# Patient Record
Sex: Female | Born: 1975 | Race: White | Hispanic: No | Marital: Married | Smoking: Never smoker
Health system: Southern US, Community
[De-identification: ages and names within clinical notes are randomized; demographics above are authoritative.]

## PROBLEM LIST (undated history)

## (undated) DIAGNOSIS — M7650 Patellar tendinitis, unspecified knee: Secondary | ICD-10-CM

## (undated) DIAGNOSIS — K5900 Constipation, unspecified: Secondary | ICD-10-CM

## (undated) DIAGNOSIS — R51 Headache: Secondary | ICD-10-CM

## (undated) DIAGNOSIS — D649 Anemia, unspecified: Secondary | ICD-10-CM

## (undated) DIAGNOSIS — I493 Ventricular premature depolarization: Secondary | ICD-10-CM

## (undated) DIAGNOSIS — K219 Gastro-esophageal reflux disease without esophagitis: Secondary | ICD-10-CM

## (undated) DIAGNOSIS — J302 Other seasonal allergic rhinitis: Secondary | ICD-10-CM

## (undated) DIAGNOSIS — E785 Hyperlipidemia, unspecified: Secondary | ICD-10-CM

## (undated) DIAGNOSIS — G4700 Insomnia, unspecified: Secondary | ICD-10-CM

## (undated) DIAGNOSIS — Z9289 Personal history of other medical treatment: Secondary | ICD-10-CM

## (undated) HISTORY — DX: Personal history of other medical treatment: Z92.89

## (undated) HISTORY — DX: Constipation, unspecified: K59.00

## (undated) HISTORY — DX: Other seasonal allergic rhinitis: J30.2

## (undated) HISTORY — DX: Patellar tendinitis, unspecified knee: M76.50

## (undated) HISTORY — PX: OTHER SURGICAL HISTORY: SHX169

## (undated) HISTORY — DX: Gastro-esophageal reflux disease without esophagitis: K21.9

## (undated) HISTORY — PX: WISDOM TOOTH EXTRACTION: SHX21

## (undated) HISTORY — DX: Insomnia, unspecified: G47.00

## (undated) HISTORY — DX: Hyperlipidemia, unspecified: E78.5

## (undated) HISTORY — DX: Headache: R51

## (undated) HISTORY — DX: Anemia, unspecified: D64.9

## (undated) HISTORY — DX: Ventricular premature depolarization: I49.3

---

## 2003-12-27 ENCOUNTER — Ambulatory Visit: Payer: Self-pay | Admitting: Family Medicine

## 2004-07-03 ENCOUNTER — Ambulatory Visit: Payer: Self-pay | Admitting: Family Medicine

## 2004-07-21 ENCOUNTER — Ambulatory Visit: Payer: Self-pay | Admitting: Family Medicine

## 2004-10-13 ENCOUNTER — Ambulatory Visit: Payer: Self-pay | Admitting: Family Medicine

## 2004-12-29 ENCOUNTER — Ambulatory Visit: Payer: Self-pay | Admitting: Family Medicine

## 2005-04-29 ENCOUNTER — Ambulatory Visit: Payer: Self-pay | Admitting: Family Medicine

## 2006-02-16 ENCOUNTER — Ambulatory Visit: Payer: Self-pay | Admitting: Family Medicine

## 2007-01-13 ENCOUNTER — Telehealth: Payer: Self-pay | Admitting: *Deleted

## 2007-03-29 ENCOUNTER — Ambulatory Visit: Payer: Self-pay | Admitting: Family Medicine

## 2007-03-29 DIAGNOSIS — G4700 Insomnia, unspecified: Secondary | ICD-10-CM | POA: Insufficient documentation

## 2007-03-29 DIAGNOSIS — D649 Anemia, unspecified: Secondary | ICD-10-CM | POA: Insufficient documentation

## 2007-03-29 DIAGNOSIS — E785 Hyperlipidemia, unspecified: Secondary | ICD-10-CM | POA: Insufficient documentation

## 2007-03-29 HISTORY — DX: Hyperlipidemia, unspecified: E78.5

## 2007-03-29 HISTORY — DX: Insomnia, unspecified: G47.00

## 2007-03-29 HISTORY — DX: Anemia, unspecified: D64.9

## 2007-04-20 ENCOUNTER — Ambulatory Visit: Payer: Self-pay | Admitting: Family Medicine

## 2007-04-20 LAB — CONVERTED CEMR LAB
Blood in Urine, dipstick: NEGATIVE
Glucose, Urine, Semiquant: NEGATIVE
Nitrite: NEGATIVE
Specific Gravity, Urine: 1.02
Urobilinogen, UA: 0.2
WBC Urine, dipstick: NEGATIVE
pH: 7

## 2007-05-01 LAB — CONVERTED CEMR LAB
ALT: 15 units/L (ref 0–35)
AST: 14 units/L (ref 0–37)
Albumin: 4.1 g/dL (ref 3.5–5.2)
Alkaline Phosphatase: 29 units/L — ABNORMAL LOW (ref 39–117)
BUN: 11 mg/dL (ref 6–23)
Basophils Absolute: 0 10*3/uL (ref 0.0–0.1)
Basophils Relative: 0.2 % (ref 0.0–1.0)
Bilirubin, Direct: 0.1 mg/dL (ref 0.0–0.3)
CO2: 30 meq/L (ref 19–32)
Calcium: 9.2 mg/dL (ref 8.4–10.5)
Chloride: 105 meq/L (ref 96–112)
Cholesterol: 189 mg/dL (ref 0–200)
Creatinine, Ser: 0.9 mg/dL (ref 0.4–1.2)
Eosinophils Absolute: 0.1 10*3/uL (ref 0.0–0.7)
Eosinophils Relative: 2.5 % (ref 0.0–5.0)
GFR calc Af Amer: 93 mL/min
GFR calc non Af Amer: 77 mL/min
Glucose, Bld: 85 mg/dL (ref 70–99)
HCT: 40.6 % (ref 36.0–46.0)
HDL: 55 mg/dL (ref >39.0)
Hemoglobin: 13.6 g/dL (ref 12.0–15.0)
LDL Cholesterol: 112 mg/dL — ABNORMAL HIGH (ref 0–99)
Lymphocytes Relative: 55 % — ABNORMAL HIGH (ref 12.0–46.0)
MCHC: 33.5 g/dL (ref 30.0–36.0)
MCV: 95.1 fL (ref 78.0–100.0)
Monocytes Absolute: 0.3 10*3/uL (ref 0.1–1.0)
Monocytes Relative: 6.6 % (ref 3.0–12.0)
Neutro Abs: 1.7 10*3/uL (ref 1.4–7.7)
Neutrophils Relative %: 35.7 % — ABNORMAL LOW (ref 43.0–77.0)
Platelets: 303 10*3/uL (ref 150–400)
Potassium: 4.3 meq/L (ref 3.5–5.1)
RBC: 4.27 M/uL (ref 3.87–5.11)
RDW: 12 % (ref 11.5–14.6)
Sodium: 140 meq/L (ref 135–145)
TSH: 1.58 microintl units/mL (ref 0.35–5.50)
Total Bilirubin: 0.7 mg/dL (ref 0.3–1.2)
Total CHOL/HDL Ratio: 3.4
Total Protein: 6.8 g/dL (ref 6.0–8.3)
Triglycerides: 111 mg/dL (ref 0–149)
VLDL: 22 mg/dL (ref 0–40)
WBC: 4.7 10*3/uL (ref 4.5–10.5)

## 2007-05-03 ENCOUNTER — Ambulatory Visit: Payer: Self-pay | Admitting: Family Medicine

## 2007-05-05 ENCOUNTER — Telehealth: Payer: Self-pay | Admitting: Family Medicine

## 2007-05-05 LAB — CONVERTED CEMR LAB: Hep B S Ab: NEGATIVE

## 2007-05-12 ENCOUNTER — Ambulatory Visit: Payer: Self-pay | Admitting: Family Medicine

## 2007-05-12 DIAGNOSIS — R519 Headache, unspecified: Secondary | ICD-10-CM | POA: Insufficient documentation

## 2007-05-12 DIAGNOSIS — R51 Headache: Secondary | ICD-10-CM

## 2007-05-12 HISTORY — DX: Headache: R51

## 2007-05-17 ENCOUNTER — Telehealth (INDEPENDENT_AMBULATORY_CARE_PROVIDER_SITE_OTHER): Payer: Self-pay | Admitting: *Deleted

## 2007-05-18 ENCOUNTER — Ambulatory Visit: Payer: Self-pay | Admitting: Internal Medicine

## 2007-06-13 ENCOUNTER — Ambulatory Visit: Payer: Self-pay | Admitting: Family Medicine

## 2007-08-17 ENCOUNTER — Encounter: Payer: Self-pay | Admitting: Family Medicine

## 2007-11-14 ENCOUNTER — Ambulatory Visit: Payer: Self-pay | Admitting: Family Medicine

## 2008-01-12 HISTORY — PX: UPPER GASTROINTESTINAL ENDOSCOPY: SHX188

## 2008-01-23 ENCOUNTER — Ambulatory Visit: Payer: Self-pay | Admitting: Family Medicine

## 2008-05-13 ENCOUNTER — Ambulatory Visit: Payer: Self-pay | Admitting: Family Medicine

## 2008-05-13 DIAGNOSIS — K219 Gastro-esophageal reflux disease without esophagitis: Secondary | ICD-10-CM | POA: Insufficient documentation

## 2008-05-13 HISTORY — DX: Gastro-esophageal reflux disease without esophagitis: K21.9

## 2008-05-16 ENCOUNTER — Ambulatory Visit: Payer: Self-pay | Admitting: Gastroenterology

## 2008-05-16 ENCOUNTER — Telehealth (INDEPENDENT_AMBULATORY_CARE_PROVIDER_SITE_OTHER): Payer: Self-pay | Admitting: *Deleted

## 2008-05-16 DIAGNOSIS — K5900 Constipation, unspecified: Secondary | ICD-10-CM

## 2008-05-16 HISTORY — DX: Constipation, unspecified: K59.00

## 2008-05-31 ENCOUNTER — Ambulatory Visit: Payer: Self-pay | Admitting: Family Medicine

## 2008-05-31 DIAGNOSIS — M7650 Patellar tendinitis, unspecified knee: Secondary | ICD-10-CM

## 2008-05-31 HISTORY — DX: Patellar tendinitis, unspecified knee: M76.50

## 2008-06-11 ENCOUNTER — Ambulatory Visit: Payer: Self-pay | Admitting: Gastroenterology

## 2008-06-21 ENCOUNTER — Encounter: Payer: Self-pay | Admitting: Gastroenterology

## 2008-06-24 ENCOUNTER — Encounter: Payer: Self-pay | Admitting: Gastroenterology

## 2008-09-20 ENCOUNTER — Ambulatory Visit: Payer: Self-pay | Admitting: Family Medicine

## 2008-09-24 LAB — CONVERTED CEMR LAB
BUN: 8 mg/dL (ref 6–23)
Basophils Absolute: 0 10*3/uL (ref 0.0–0.1)
Bilirubin, Direct: 0 mg/dL (ref 0.0–0.3)
CO2: 27 meq/L (ref 19–32)
Chloride: 105 meq/L (ref 96–112)
Cholesterol: 204 mg/dL — ABNORMAL HIGH (ref 0–200)
Creatinine, Ser: 0.9 mg/dL (ref 0.4–1.2)
Direct LDL: 115.1 mg/dL
Eosinophils Absolute: 0.1 10*3/uL (ref 0.0–0.7)
Glucose, Bld: 88 mg/dL (ref 70–99)
HCT: 40.3 % (ref 36.0–46.0)
Lymphs Abs: 1.1 10*3/uL (ref 0.7–4.0)
MCHC: 35.2 g/dL (ref 30.0–36.0)
MCV: 94.3 fL (ref 78.0–100.0)
Monocytes Absolute: 0.4 10*3/uL (ref 0.1–1.0)
Neutrophils Relative %: 63.9 % (ref 43.0–77.0)
Platelets: 261 10*3/uL (ref 150.0–400.0)
RDW: 11.7 % (ref 11.5–14.6)
TSH: 1.18 microintl units/mL (ref 0.35–5.50)
Total Bilirubin: 0.9 mg/dL (ref 0.3–1.2)
VLDL: 32.8 mg/dL (ref 0.0–40.0)
WBC: 4.4 10*3/uL — ABNORMAL LOW (ref 4.5–10.5)

## 2008-10-03 ENCOUNTER — Ambulatory Visit: Payer: Self-pay | Admitting: Gastroenterology

## 2008-10-03 ENCOUNTER — Ambulatory Visit: Payer: Self-pay | Admitting: Family Medicine

## 2008-10-11 ENCOUNTER — Ambulatory Visit: Payer: Self-pay | Admitting: Gastroenterology

## 2008-10-11 LAB — CONVERTED CEMR LAB: UREASE: NEGATIVE

## 2009-02-03 ENCOUNTER — Telehealth: Payer: Self-pay | Admitting: Family Medicine

## 2009-03-20 ENCOUNTER — Telehealth (INDEPENDENT_AMBULATORY_CARE_PROVIDER_SITE_OTHER): Payer: Self-pay | Admitting: *Deleted

## 2009-08-22 ENCOUNTER — Telehealth: Payer: Self-pay | Admitting: Family Medicine

## 2009-09-19 ENCOUNTER — Telehealth: Payer: Self-pay | Admitting: Family Medicine

## 2009-09-19 ENCOUNTER — Encounter: Admission: RE | Admit: 2009-09-19 | Discharge: 2009-09-19 | Payer: Self-pay | Admitting: Obstetrics and Gynecology

## 2009-09-29 ENCOUNTER — Ambulatory Visit: Payer: Self-pay | Admitting: Family Medicine

## 2009-10-01 LAB — CONVERTED CEMR LAB
ALT: 14 units/L (ref 0–35)
AST: 16 units/L (ref 0–37)
Basophils Relative: 0.8 % (ref 0.0–3.0)
Bilirubin, Direct: 0.1 mg/dL (ref 0.0–0.3)
Chloride: 103 meq/L (ref 96–112)
Cholesterol: 279 mg/dL — ABNORMAL HIGH (ref 0–200)
Creatinine, Ser: 0.7 mg/dL (ref 0.4–1.2)
Direct LDL: 184.3 mg/dL
Eosinophils Relative: 2.5 % (ref 0.0–5.0)
Free T4: 0.75 ng/dL (ref 0.60–1.60)
GFR calc non Af Amer: 95.77 mL/min (ref >60)
HCT: 41 % (ref 36.0–46.0)
Leukocytes, UA: NEGATIVE
MCV: 94.9 fL (ref 78.0–100.0)
Monocytes Absolute: 0.3 10*3/uL (ref 0.1–1.0)
Monocytes Relative: 5.8 % (ref 3.0–12.0)
Neutrophils Relative %: 42.5 % — ABNORMAL LOW (ref 43.0–77.0)
Nitrite: NEGATIVE
Platelets: 323 10*3/uL (ref 150.0–400.0)
Potassium: 4.3 meq/L (ref 3.5–5.1)
RBC: 4.31 M/uL (ref 3.87–5.11)
Specific Gravity, Urine: 1.025 (ref 1.000–1.030)
T3, Free: 2.9 pg/mL (ref 2.3–4.2)
Total Bilirubin: 0.3 mg/dL (ref 0.3–1.2)
Total CHOL/HDL Ratio: 4
Total Protein: 6.4 g/dL (ref 6.0–8.3)
Urobilinogen, UA: 0.2 (ref 0.0–1.0)
VLDL: 28 mg/dL (ref 0.0–40.0)
WBC: 5.2 10*3/uL (ref 4.5–10.5)
pH: 6 (ref 5.0–8.0)

## 2009-10-13 ENCOUNTER — Ambulatory Visit: Payer: Self-pay | Admitting: Family Medicine

## 2009-10-31 ENCOUNTER — Ambulatory Visit: Payer: Self-pay | Admitting: Family Medicine

## 2010-01-11 HISTORY — PX: HAMMER TOE SURGERY: SHX385

## 2010-01-22 ENCOUNTER — Telehealth: Payer: Self-pay | Admitting: Family Medicine

## 2010-01-29 ENCOUNTER — Encounter: Payer: Self-pay | Admitting: *Deleted

## 2010-01-30 ENCOUNTER — Ambulatory Visit
Admission: RE | Admit: 2010-01-30 | Discharge: 2010-01-30 | Payer: Self-pay | Source: Home / Self Care | Attending: Family Medicine | Admitting: Family Medicine

## 2010-01-30 ENCOUNTER — Encounter: Payer: Self-pay | Admitting: Family Medicine

## 2010-01-30 ENCOUNTER — Other Ambulatory Visit: Payer: Self-pay | Admitting: Family Medicine

## 2010-01-30 LAB — HEPATIC FUNCTION PANEL
ALT: 34 U/L (ref 0–35)
AST: 24 U/L (ref 0–37)
Albumin: 4.1 g/dL (ref 3.5–5.2)
Alkaline Phosphatase: 37 U/L — ABNORMAL LOW (ref 39–117)
Bilirubin, Direct: 0.1 mg/dL (ref 0.0–0.3)
Total Bilirubin: 0.4 mg/dL (ref 0.3–1.2)
Total Protein: 6.7 g/dL (ref 6.0–8.3)

## 2010-01-30 LAB — LIPID PANEL
Cholesterol: 200 mg/dL (ref 0–200)
HDL: 66.5 mg/dL (ref >39.00)
LDL Cholesterol: 104 mg/dL — ABNORMAL HIGH (ref 0–99)
Total CHOL/HDL Ratio: 3
Triglycerides: 149 mg/dL (ref 0.0–149.0)
VLDL: 29.8 mg/dL (ref 0.0–40.0)

## 2010-02-10 NOTE — Progress Notes (Signed)
Summary: f/u?  Phone Note Call from Patient Call back at Home Phone [phone]   Caller: Patient Call For: Dr. Jarold Motto Reason for Call: Talk to Nurse Summary of Call: pt wants to know if she is needing to sch a f/u appt with Dr. Jarold Motto since her EGD in October of 2010 Initial call taken by: Vallarie Mare,  March 20, 2009 10:29 AM  Follow-up for Phone Call        yes..see nme.. Follow-up by: Mardella Layman MD Clementeen Graham,  March 20, 2009 11:56 AM  Additional Follow-up for Phone Call Additional follow up Details #1::        Please sch follow up appt with Dr. Jarold Motto at pt's convience.  thanks Additional Follow-up by: Ashok Cordia RN,  March 20, 2009 12:16 PM    Additional Follow-up for Phone Call Additional follow up Details #3:: Details for Additional Follow-up Action Taken: Called pt. and L/M on voicemail to call back to schedule f/u appt. w/Dr. Jarold Motto Additional Follow-up by: Karna Christmas,  March 20, 2009 2:29 PM

## 2010-02-10 NOTE — Progress Notes (Signed)
Summary: refill zolpidem  Phone Note Refill Request Message from:  Fax from Pharmacy on August 22, 2009 9:10 AM  Refills Requested: Medication #1:  ZOLPIDEM TARTRATE 10 MG TABS at bedtime as needed   Dosage confirmed as above?Dosage Confirmed   Last Refilled: 07/15/2009  Method Requested: Fax to Local Pharmacy Initial call taken by: Raechel Ache, RN,  August 22, 2009 9:11 AM Caller: [facility] [phone]*  Follow-up for Phone Call        call in #30 with 5 rf Follow-up by: Nelwyn Salisbury MD,  August 22, 2009 2:19 PM  Additional Follow-up for Phone Call Additional follow up Details #1::        Rx faxed to pharmacy Additional Follow-up by: Raechel Ache, RN,  August 22, 2009 2:29 PM    Prescriptions: ZOLPIDEM TARTRATE 10 MG TABS (ZOLPIDEM TARTRATE) at bedtime as needed  #30 x 5   Entered by:   Raechel Ache, RN   Authorized by:   Nelwyn Salisbury MD   Signed by:   Raechel Ache, RN on 08/22/2009   Method used:   Historical   RxID:   [id number]

## 2010-02-10 NOTE — Progress Notes (Signed)
Summary: refill zolpidem  Phone Note From Pharmacy   Caller: [id number] Call For: Ari Engelbrecht  Summary of Call: refill zolpidem tartrate 10mg  1 by mouth at bedtime Initial call taken by: Alfred Levins, CMA,  February 03, 2009 2:07 PM  Follow-up for Phone Call        No I gave her a 6 month supply in September Follow-up by: Nelwyn Salisbury MD,  February 03, 2009 5:15 PM  Additional Follow-up for Phone Call Additional follow up Details #1::        pharmacy said they lost the refills due to transferring rx to a new pharmacy.  Pharmacy is req a new rx Additional Follow-up by: Alfred Levins, CMA,  February 03, 2009 5:17 PM    Additional Follow-up for Phone Call Additional follow up Details #2::    call in #30 with 5 rf Follow-up by: Nelwyn Salisbury MD,  February 04, 2009 8:43 AM  Additional Follow-up for Phone Call Additional follow up Details #3:: Details for Additional Follow-up Action Taken: rx called in Additional Follow-up by: Alfred Levins, CMA,  February 04, 2009 9:15 AM  Prescriptions: ZOLPIDEM TARTRATE 10 MG TABS (ZOLPIDEM TARTRATE) at bedtime as needed  #30 x 5   Entered by:   Alfred Levins, CMA   Authorized by:   Nelwyn Salisbury MD   Signed by:   Alfred Levins, CMA on 02/04/2009   Method used:   Telephoned to ...       [facility] [address] (retail)       [address]       [place], [place]  [address]       Ph: [phone] or [phone]       Fax: [phone]   RxID:   [id number]

## 2010-02-10 NOTE — Assessment & Plan Note (Signed)
Summary: CPX/CJR   Vital Signs:  Patient profile:   35 year old female Height:      63.5 inches Weight:      155 pounds BMI:     27.12 O2 Sat:      98 % Temp:     98.9 degrees F Pulse rate:   86 / minute BP sitting:   120 / 80  (left arm)  Vitals Entered By: Pura Spice, RN (October 13, 2009 2:46 PM) CC: cpx discuss labs Is Patient Diabetic? No   History of Present Illness: 35 yr old female for a cpx. She feels fine in general, although she admits to putting on some weight in the past year.   Allergies: 1)  ! Sulfa  Past History:  Past Medical History: Reviewed history from 10/03/2008 and no changes required. Anemia-NOS Hyperlipidemia insomnia GERD, sees Dr. Jarold Motto sees Dr. Annye Rusk for GYN exams  Past Surgical History: Reviewed history from 06/11/2008 and no changes required. Mole removed from tear duct  Past History:  Care Management: Gynecology: Dr Rito Ehrlich    Family History: Reviewed history from 06/11/2008 and no changes required. Family History of CAD Female 1st degree relative <50: Father Family History High cholesterol Family History of Breast Cancer:Mother Family History of Diabetes:Father  No FH of Colon Cancer: Family History of Ovarian Cancer: Aunt  Social History: Reviewed history from 10/03/2008 and no changes required. Married Alcohol use-yes-2-3 beers/week Drug use-no Occupation: Flight Attendant Patient is a former smoker. -stopped in high school. Daily Caffeine Use -2 Patient does not get regular exercise.   Review of Systems  The patient denies anorexia, fever, weight loss, vision loss, decreased hearing, hoarseness, chest pain, syncope, dyspnea on exertion, peripheral edema, prolonged cough, headaches, hemoptysis, abdominal pain, melena, hematochezia, severe indigestion/heartburn, hematuria, incontinence, genital sores, muscle weakness, suspicious skin lesions, transient blindness, difficulty walking, depression, unusual  weight change, abnormal bleeding, enlarged lymph nodes, angioedema, breast masses, and testicular masses.         Flu Vaccine Consent Questions     Do you have a history of severe allergic reactions to this vaccine? no    Any prior history of allergic reactions to egg and/or gelatin? no    Do you have a sensitivity to the preservative Thimersol? no    Do you have a past history of Guillan-Barre Syndrome? no    Do you currently have an acute febrile illness? no    Have you ever had a severe reaction to latex? no    Vaccine information given and explained to patient? yes    Are you currently pregnant? no    Lot Number:[id number]   Exp Date:07/11/2010   Site Given  Left Deltoid IM Pura Spice, RN  October 13, 2009 3:25 PM   Physical Exam  General:  Well-developed,well-nourished,in no acute distress; alert,appropriate and cooperative throughout examination Head:  Normocephalic and atraumatic without obvious abnormalities. No apparent alopecia or balding. Eyes:  No corneal or conjunctival inflammation noted. EOMI. Perrla. Funduscopic exam benign, without hemorrhages, exudates or papilledema. Vision grossly normal. Ears:  External ear exam shows no significant lesions or deformities.  Otoscopic examination reveals clear canals, tympanic membranes are intact bilaterally without bulging, retraction, inflammation or discharge. Hearing is grossly normal bilaterally. Nose:  External nasal examination shows no deformity or inflammation. Nasal mucosa are pink and moist without lesions or exudates. Mouth:  Oral mucosa and oropharynx without lesions or exudates.  Teeth in good repair. Neck:  No deformities,  masses, or tenderness noted. Chest Wall:  No deformities, masses, or tenderness noted. Lungs:  Normal respiratory effort, chest expands symmetrically. Lungs are clear to auscultation, no crackles or wheezes. Heart:  Normal rate and regular rhythm. S1 and S2 normal without gallop, murmur, click, rub  or other extra sounds. Abdomen:  Bowel sounds positive,abdomen soft and non-tender without masses, organomegaly or hernias noted. Msk:  No deformity or scoliosis noted of thoracic or lumbar spine.   Pulses:  R and L carotid,radial,femoral,dorsalis pedis and posterior tibial pulses are full and equal bilaterally Extremities:  No clubbing, cyanosis, edema, or deformity noted with normal full range of motion of all joints.   Neurologic:  No cranial nerve deficits noted. Station and gait are normal. Plantar reflexes are down-going bilaterally. DTRs are symmetrical throughout. Sensory, motor and coordinative functions appear intact. Skin:  Intact without suspicious lesions or rashes Cervical Nodes:  No lymphadenopathy noted Axillary Nodes:  No palpable lymphadenopathy Inguinal Nodes:  No significant adenopathy Psych:  Cognition and judgment appear intact. Alert and cooperative with normal attention span and concentration. No apparent delusions, illusions, hallucinations   Impression & Recommendations:  Problem # 1:  HEALTH MAINTENANCE EXAM (ICD-V70.0)  Complete Medication List: 1)  Yasmin 28 3-0.03 Mg Tabs (Drospirenone-ethinyl estradiol) .Marland Kitchen.. 1 by mouth once daily 2)  Zolpidem Tartrate 10 Mg Tabs (Zolpidem tartrate) .... At bedtime as needed 3)  Prilosec Otc 20 Mg Tbec (Omeprazole magnesium) .... One by mouth daily as needed 4)  [facility] Ibuprofen 200 Mg Tabs (Ibuprofen) .... Take 3 tablets by mouth as needed 5)  Zocor 40 Mg Tabs (Simvastatin) .Marland Kitchen.. 1 by mouth once daily  Other Orders: Admin 1st Vaccine (16109) Flu Vaccine 77yrs + (60454)  Patient Instructions: 1)  It is important that you exercise reguarly at least 20 minutes 5 times a week. If you develop chest pain, have severe difficulty breathing, or feel very tired, stop exercising immediately and seek medical attention.  2)  You need to lose weight. Consider a lower calorie diet and regular exercise.  3)  Please schedule a follow-up  appointment in 3 months .

## 2010-02-10 NOTE — Progress Notes (Signed)
Summary: Pt is req to have complete multi lvl thyroid panel added to cpx   Phone Note Call from Patient Call back at Home Phone [phone]   Caller: Patient Summary of Call: Pt called and has sch her cpx and labs. Pt is req to get a complete multi lvl thyroid panel added to cpx labs. Pls advise.  Initial call taken by: Lucy Antigua,  September 19, 2009 10:01 AM  Follow-up for Phone Call        okay. In addition to the TSH, order a free T3 and free T4 for V70.0 Follow-up by: Nelwyn Salisbury MD,  September 19, 2009 1:22 PM  Additional Follow-up for Phone Call Additional follow up Details #1::        done Additional Follow-up by: Pura Spice, RN,  September 19, 2009 2:15 PM

## 2010-02-10 NOTE — Assessment & Plan Note (Signed)
Summary: fu on tick bite/njr   Vital Signs:  Patient profile:   35 year old female O2 Sat:      99 % Temp:     98.9 degrees F Pulse rate:   101 / minute BP sitting:   104 / 72  (left arm)  Vitals Entered By: Pura Spice, RN (October 31, 2009 8:48 AM) CC: pulled tick from rt quadrant  was put on z pk some reddness from site.    History of Present Illness: Here to follow up after a tick bite which she discovered 4 days ago. She saw the tick on her right flank and pulled it off. She went to an Urgent Care clinic, and they gave her a Zpack. She has now taken 4 days of this. She feels fine, no myalgias, no rashes , etc.   Allergies: 1)  ! Sulfa  Past History:  Past Medical History: Reviewed history from 10/03/2008 and no changes required. Anemia-NOS Hyperlipidemia insomnia GERD, sees Dr. Jarold Motto sees Dr. Annye Rusk for GYN exams  Review of Systems  The patient denies anorexia, fever, weight loss, weight gain, vision loss, decreased hearing, hoarseness, chest pain, syncope, dyspnea on exertion, peripheral edema, prolonged cough, headaches, hemoptysis, abdominal pain, melena, hematochezia, severe indigestion/heartburn, hematuria, incontinence, genital sores, muscle weakness, suspicious skin lesions, transient blindness, difficulty walking, depression, unusual weight change, abnormal bleeding, enlarged lymph nodes, angioedema, breast masses, and testicular masses.    Physical Exam  General:  Well-developed,well-nourished,in no acute distress; alert,appropriate and cooperative throughout examination Skin:  Intact without suspicious lesions or rashes   Impression & Recommendations:  Problem # 1:  TICK BITE (ICD-E906.4)  Complete Medication List: 1)  Yasmin 28 3-0.03 Mg Tabs (Drospirenone-ethinyl estradiol) .Marland Kitchen.. 1 by mouth once daily 2)  Zolpidem Tartrate 10 Mg Tabs (Zolpidem tartrate) .... At bedtime as needed 3)  [facility]vs Ibuprofen 200 Mg Tabs (Ibuprofen) .... Take 3  tablets by mouth as needed 4)  Zocor 40 Mg Tabs (Simvastatin) .Marland Kitchen.. 1 by mouth once daily  Patient Instructions: 1)  Finish out the antibiotics.  2)  Please schedule a follow-up appointment as needed .    Orders Added: 1)  Est. Patient Level III [62703]

## 2010-02-12 NOTE — Assessment & Plan Note (Signed)
Summary: 3 month rov/pt will come in fasting/njr   Vital Signs:  Patient profile:   35 year old female Weight:      156 pounds O2 Sat:      98 % Temp:     98.8 degrees F Pulse rate:   68 / minute BP sitting:   120 / 80  (left arm)  Vitals Entered By: Pura Spice, RN (January 30, 2010 8:40 AM) CC: 3 month follow up pt fasting can't tolerate zocor  going to weight watchers  on red yeast arice    History of Present Illness: Here to recheck lipids. She took Zocor for 2 1/2 months, but stopped it 2 weeks ago due to complaints of fatigue, musclke aches, and "tunnel vision". These have all resolved now and she feels fine. She started taking OTC red yeast rice 2 days ago.   Allergies: 1)  ! Sulfa  Past History:  Past Medical History: Reviewed history from 10/03/2008 and no changes required. Anemia-NOS Hyperlipidemia insomnia GERD, sees Dr. Jarold Motto sees Dr. Annye Rusk for GYN exams  Review of Systems  The patient denies anorexia, fever, weight loss, weight gain, vision loss, decreased hearing, hoarseness, chest pain, syncope, dyspnea on exertion, peripheral edema, prolonged cough, headaches, hemoptysis, abdominal pain, melena, hematochezia, severe indigestion/heartburn, hematuria, incontinence, genital sores, muscle weakness, suspicious skin lesions, transient blindness, difficulty walking, depression, unusual weight change, abnormal bleeding, enlarged lymph nodes, angioedema, breast masses, and testicular masses.    Physical Exam  General:  Well-developed,well-nourished,in no acute distress; alert,appropriate and cooperative throughout examination   Impression & Recommendations:  Problem # 1:  HYPERLIPIDEMIA (ICD-272.4)  The following medications were removed from the medication list:    Zocor 40 Mg Tabs (Simvastatin) .Marland Kitchen... 1 by mouth once daily  Orders: Venipuncture (16109) TLB-Lipid Panel (80061-LIPID) TLB-Hepatic/Liver Function Pnl (80076-HEPATIC)  Complete  Medication List: 1)  Yasmin 28 3-0.03 Mg Tabs (Drospirenone-ethinyl estradiol) .Marland Kitchen.. 1 by mouth once daily 2)  Zolpidem Tartrate 10 Mg Tabs (Zolpidem tartrate) .... At bedtime as needed 3)  [facility]vs Ibuprofen 200 Mg Tabs (Ibuprofen) .... Take 3 tablets by mouth as needed 4)  Red Yeast Rice 600 Mg Caps (Red yeast rice extract)  Patient Instructions: 1)  get labs today   Orders Added: 1)  Venipuncture [36415] 2)  TLB-Lipid Panel [80061-LIPID] 3)  TLB-Hepatic/Liver Function Pnl [80076-HEPATIC] 4)  Est. Patient Level III [60454]  Appended Document: Orders Update    Clinical Lists Changes  Orders: Added new Service order of Specimen Handling (09811) - Signed

## 2010-02-12 NOTE — Progress Notes (Signed)
Summary: stopped Simvastatin  Phone Note Call from Patient Call back at Home Phone [phone]   Caller: Patient Call For: Nelwyn Salisbury MD Summary of Call: Pt is on Simvastatin, and having headaches, tunnel vision, and started taking it q o night.  What should she do?  May leave a message. Initial call taken by: [facility] CMA AAMA,  January 22, 2010 3:25 PM  Follow-up for Phone Call        stop it altogether and let me know how she is feeling in 2 weeks  Follow-up by: Nelwyn Salisbury MD,  January 23, 2010 10:52 AM  Additional Follow-up for Phone Call Additional follow up Details #1::        Left detailed message per pt request.

## 2010-03-13 ENCOUNTER — Telehealth: Payer: Self-pay | Admitting: *Deleted

## 2010-03-13 MED ORDER — ZOLPIDEM TARTRATE 10 MG PO TABS: 10.0000 mg | ORAL_TABLET | Freq: Every evening | ORAL | Status: DC | PRN

## 2010-03-13 NOTE — Telephone Encounter (Signed)
rx request faxed back to pharmacy

## 2010-03-13 NOTE — Telephone Encounter (Signed)
Call in #30 with 5 rf 

## 2010-03-13 NOTE — Telephone Encounter (Signed)
Refill zolpidem 10 mg 1 po qhs 

## 2010-07-21 ENCOUNTER — Ambulatory Visit (INDEPENDENT_AMBULATORY_CARE_PROVIDER_SITE_OTHER): Payer: Medicare HMO | Admitting: Internal Medicine

## 2010-07-21 ENCOUNTER — Encounter: Payer: Self-pay | Admitting: Internal Medicine

## 2010-07-21 DIAGNOSIS — M7650 Patellar tendinitis, unspecified knee: Secondary | ICD-10-CM

## 2010-07-21 MED ORDER — DICLOFENAC SODIUM 75 MG PO TBEC: DELAYED_RELEASE_TABLET | ORAL | Status: AC

## 2010-07-21 NOTE — Progress Notes (Signed)
  Subjective:    Patient ID: [name] Bowman, female    DOB: 12/19/1975, 35 y.o.   MRN: [id number]  HPI Pt presents to clinic for evaluation of knee pain. Notes several day history of knee pain without trauma or injury. Recently completed vacation with hiking. Denies instability or swelling of the knee. Take ibuprofen 600 mg on one occasion without significant improvement. Notes pain mostly at rest. No other alleviating or exacerbating factors. No other complaints.  Reviewed past medical history, medications and allergies.  Review of Systems  Musculoskeletal: Positive for arthralgias. Negative for back pain, joint swelling and gait problem.  Skin: Negative for color change and rash.       Objective:   Physical Exam  Nursing note and vitals reviewed. Constitutional: She appears well-developed and well-nourished. No distress.  HENT:  Head: Normocephalic and atraumatic.  Eyes: Conjunctivae are normal. No scleral icterus.  Musculoskeletal:       Left knee without erythema warmth or effusion. Full range of motion. Mild crepitus noted. Slight tenderness to palpation along superior patellar tendon and medial collateral ligament. Gait normal  Neurological: She is alert.  Skin: Skin is warm and dry. No rash noted. She is not diaphoretic. No erythema.  Psychiatric: She has a normal mood and affect.          Assessment & Plan:

## 2010-07-21 NOTE — Assessment & Plan Note (Signed)
Attempt to Voltaren 75 mg twice a day for five days followed by twice a day as needed. Take with food and no other anti-inflammatories.Followup if no improvement or worsening.

## 2010-11-10 ENCOUNTER — Ambulatory Visit: Payer: Managed Care, Other (non HMO) | Attending: Sports Medicine | Admitting: Physical Therapy

## 2010-11-10 DIAGNOSIS — IMO0001 Reserved for inherently not codable concepts without codable children: Secondary | ICD-10-CM | POA: Insufficient documentation

## 2010-11-10 DIAGNOSIS — M25559 Pain in unspecified hip: Secondary | ICD-10-CM | POA: Insufficient documentation

## 2010-11-11 ENCOUNTER — Other Ambulatory Visit (INDEPENDENT_AMBULATORY_CARE_PROVIDER_SITE_OTHER): Payer: Managed Care, Other (non HMO)

## 2010-11-11 ENCOUNTER — Ambulatory Visit: Payer: Managed Care, Other (non HMO) | Admitting: Physical Therapy

## 2010-11-11 DIAGNOSIS — Z0000 Encounter for general adult medical examination without abnormal findings: Secondary | ICD-10-CM

## 2010-11-11 DIAGNOSIS — Z79899 Other long term (current) drug therapy: Secondary | ICD-10-CM

## 2010-11-11 LAB — LIPID PANEL
HDL: 65.9 mg/dL (ref >39.00)
Triglycerides: 275 mg/dL — ABNORMAL HIGH (ref 0.0–149.0)
VLDL: 55 mg/dL — ABNORMAL HIGH (ref 0.0–40.0)

## 2010-11-11 LAB — CBC WITH DIFFERENTIAL/PLATELET
Basophils Absolute: 0 10*3/uL (ref 0.0–0.1)
Basophils Relative: 0.7 % (ref 0.0–3.0)
Eosinophils Absolute: 0.1 10*3/uL (ref 0.0–0.7)
Lymphocytes Relative: 45 % (ref 12.0–46.0)
MCHC: 34.1 g/dL (ref 30.0–36.0)
MCV: 95.2 fl (ref 78.0–100.0)
Monocytes Absolute: 0.3 10*3/uL (ref 0.1–1.0)
Neutro Abs: 2.8 10*3/uL (ref 1.4–7.7)
Neutrophils Relative %: 47.5 % (ref 43.0–77.0)
RBC: 4.21 Mil/uL (ref 3.87–5.11)
RDW: 12.4 % (ref 11.5–14.6)

## 2010-11-11 LAB — BASIC METABOLIC PANEL
CO2: 22 mEq/L (ref 19–32)
Calcium: 8.4 mg/dL (ref 8.4–10.5)
Chloride: 104 mEq/L (ref 96–112)
Creatinine, Ser: 1 mg/dL (ref 0.4–1.2)
Glucose, Bld: 84 mg/dL (ref 70–99)

## 2010-11-11 LAB — POCT URINALYSIS DIPSTICK
Ketones, UA: NEGATIVE
Protein, UA: NEGATIVE
Spec Grav, UA: 1.02
Urobilinogen, UA: 0.2
pH, UA: 5.5

## 2010-11-11 LAB — HEPATIC FUNCTION PANEL
Albumin: 3.7 g/dL (ref 3.5–5.2)
Bilirubin, Direct: 0 mg/dL (ref 0.0–0.3)
Total Protein: 6.9 g/dL (ref 6.0–8.3)

## 2010-11-13 NOTE — Progress Notes (Signed)
Quick Note:  Left a message for pt to return call. ______ 

## 2010-11-16 ENCOUNTER — Ambulatory Visit: Payer: Managed Care, Other (non HMO) | Attending: Sports Medicine | Admitting: Physical Therapy

## 2010-11-16 ENCOUNTER — Telehealth: Payer: Self-pay | Admitting: Family Medicine

## 2010-11-16 DIAGNOSIS — IMO0001 Reserved for inherently not codable concepts without codable children: Secondary | ICD-10-CM | POA: Insufficient documentation

## 2010-11-16 DIAGNOSIS — M25559 Pain in unspecified hip: Secondary | ICD-10-CM | POA: Insufficient documentation

## 2010-11-16 NOTE — Telephone Encounter (Signed)
Left voice message with results.

## 2010-11-16 NOTE — Telephone Encounter (Signed)
Message copied by Baldemar Friday on Mon Nov 16, 2010  9:46 AM ------      Message from: Gershon Crane A      Created: Thu Nov 12, 2010  3:50 PM       Normal except high chol and TG. Watch the diet

## 2010-11-18 ENCOUNTER — Ambulatory Visit (INDEPENDENT_AMBULATORY_CARE_PROVIDER_SITE_OTHER): Payer: Managed Care, Other (non HMO) | Admitting: Family Medicine

## 2010-11-18 ENCOUNTER — Encounter: Payer: Self-pay | Admitting: Family Medicine

## 2010-11-18 ENCOUNTER — Ambulatory Visit: Payer: Managed Care, Other (non HMO)

## 2010-11-18 VITALS — BP 114/78 | HR 88 | Temp 98.7°F | Ht 63.5 in | Wt 162.0 lb

## 2010-11-18 DIAGNOSIS — Z23 Encounter for immunization: Secondary | ICD-10-CM

## 2010-11-18 DIAGNOSIS — Z0000 Encounter for general adult medical examination without abnormal findings: Secondary | ICD-10-CM

## 2010-11-18 MED ORDER — ZOLPIDEM TARTRATE 10 MG PO TABS: 10.0000 mg | ORAL_TABLET | Freq: Every evening | ORAL | Status: DC | PRN

## 2010-11-18 MED ORDER — ATORVASTATIN CALCIUM 20 MG PO TABS: 20.0000 mg | ORAL_TABLET | Freq: Every day | ORAL | Status: DC

## 2010-11-18 MED ORDER — NABUMETONE 500 MG PO TABS: 500.0000 mg | ORAL_TABLET | Freq: Two times a day (BID) | ORAL | Status: DC | PRN
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
## 2010-11-18 NOTE — Progress Notes (Signed)
Addended by: Aniceto Boss A on: 11/18/2010 05:05 PM   Modules accepted: Orders

## 2010-11-25 ENCOUNTER — Encounter: Payer: Medicare HMO | Admitting: Physical Therapy

## 2010-11-30 ENCOUNTER — Encounter: Payer: Medicare HMO | Admitting: Physical Therapy

## 2011-01-21 DIAGNOSIS — I493 Ventricular premature depolarization: Secondary | ICD-10-CM

## 2011-01-21 HISTORY — DX: Ventricular premature depolarization: I49.3

## 2011-06-17 ENCOUNTER — Telehealth: Payer: Self-pay | Admitting: Family Medicine

## 2011-06-17 MED ORDER — ATORVASTATIN CALCIUM 20 MG PO TABS: 20.0000 mg | ORAL_TABLET | Freq: Every day | ORAL | Status: DC

## 2011-06-17 NOTE — Telephone Encounter (Signed)
Pt requested a 90 day supply of Atorvastatin and send to [facility]. I sent script e[facility]scribe.

## 2011-07-14 ENCOUNTER — Encounter: Payer: Self-pay | Admitting: Internal Medicine

## 2011-07-14 ENCOUNTER — Ambulatory Visit (INDEPENDENT_AMBULATORY_CARE_PROVIDER_SITE_OTHER): Payer: Managed Care, Other (non HMO) | Admitting: Internal Medicine

## 2011-07-14 VITALS — BP 108/70 | Temp 98.5°F | Wt 164.0 lb

## 2011-07-14 DIAGNOSIS — R609 Edema, unspecified: Secondary | ICD-10-CM

## 2011-07-14 MED ORDER — FUROSEMIDE 20 MG PO TABS: 20.0000 mg | ORAL_TABLET | Freq: Every day | ORAL | Status: DC

## 2011-07-14 NOTE — Assessment & Plan Note (Addendum)
36 year old white female complains of ankle and foot swelling. I suspect her symptoms are related to her long car trip to [place] and [place]. She may also have mild venous insufficiency. Patient advised to use Lasix 20 mg as needed. She understands to increase intake of high potassium foods.  Follow up with her PCP in 2 weeks.  Patient advised to call office if symptoms persist or worsen.

## 2011-07-14 NOTE — Progress Notes (Signed)
Subjective:    Patient ID: [name] Bowman, female    DOB: 09/27/1975, 36 y.o.   MRN: [id number]  HPI  36 year old white female complains of foot and ankle swelling over the last several days. She recently returned from a prolonged car ride to [place] and [place]. Over last couple of days her swelling has slightly improved. Activity seems to improve her symptoms. She denies associated calf pain.  She also denies any shortness of breath or chest pain.  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 She was referred to a cardiologist.  She never had a stress Myoview. However patient reports she had normal stress echo 4-5 years ago.  Her brother is noted to have "leaky valve" and her father has history of coronary artery disease with hx of  CABG x4.  She drinks several caffeinated beverages per day.  Review of Systems No hand or facial swelling, no urinary changes  Past Medical History  Diagnosis Date  . HYPERLIPIDEMIA 03/29/2007  . ANEMIA-NOS 03/29/2007  . GERD 05/13/2008  . CONSTIPATION 05/16/2008  . Patellar tendinitis 05/31/2008  . INSOMNIA 03/29/2007  . Headache 05/12/2007    History   Social History  . Marital Status: Single    Spouse Name: N/A    Number of Children: N/A  . Years of Education: N/A   Occupational History  . Not on file.   Social History Main Topics  . Smoking status: Never Smoker   . Smokeless tobacco: Never Used  . Alcohol Use: 0.5 oz/week    1 drink(s) per week  . Drug Use: No  . Sexually Active: Not on file   Other Topics Concern  . Not on file   Social History Narrative  . No narrative on file    No past surgical history on file.  No family history on file.  Allergies  Allergen Reactions  . Sulfonamide Derivatives     REACTION: rash    Current Outpatient Prescriptions on File Prior to Visit  Medication Sig Dispense Refill  . atorvastatin (LIPITOR) 20 MG tablet Take 1 tablet (20 mg total) by mouth daily.  90  tablet  0  . fish oil-omega-3 fatty acids 1000 MG capsule Take 1 g by mouth daily.        Marland Kitchen glucosamine-chondroitin 500-400 MG tablet Take 1 tablet by mouth daily.        Marland Kitchen ibuprofen (ADVIL,MOTRIN) 200 MG tablet Take 200 mg by mouth. 3 tabs by mouth as needed       . Multiple Vitamin (MULTIVITAMIN) tablet Take 1 tablet by mouth daily.        Marland Kitchen zolpidem (AMBIEN) 10 MG tablet Take 1 tablet (10 mg total) by mouth at bedtime as needed.  30 tablet  5  . furosemide (LASIX) 20 MG tablet Take 1 tablet (20 mg total) by mouth daily.  30 tablet  0    BP 108/70  Temp 98.5 F (36.9 C) (Oral)  Wt 164 lb (74.39 kg)        Objective:   Physical Exam  Constitutional: She is oriented to person, place, and time. She appears well-developed and well-nourished.  Cardiovascular: Normal rate and normal heart sounds.        Occasional ectopic beats  Pulmonary/Chest: Effort normal. She has no wheezes.  Musculoskeletal:       Trace lower extremity edema Negative calf tenderness, negative Homans sign  Neurological: She is alert and oriented to person, place, and time.  Skin: Skin is warm and dry.  Psychiatric: She has a normal mood and affect. Her behavior is normal.      Assessment & Plan:

## 2011-07-14 NOTE — Patient Instructions (Signed)
Call our office if your foot and ankle swelling gets worse.

## 2011-09-08 ENCOUNTER — Other Ambulatory Visit: Payer: Self-pay | Admitting: Internal Medicine

## 2011-10-20 ENCOUNTER — Other Ambulatory Visit: Payer: Self-pay | Admitting: Family Medicine

## 2012-01-15 ENCOUNTER — Other Ambulatory Visit: Payer: Self-pay | Admitting: Family Medicine

## 2012-01-25 ENCOUNTER — Encounter: Payer: Self-pay | Admitting: Family Medicine

## 2012-01-25 ENCOUNTER — Ambulatory Visit (INDEPENDENT_AMBULATORY_CARE_PROVIDER_SITE_OTHER): Payer: Managed Care, Other (non HMO) | Admitting: Family Medicine

## 2012-01-25 VITALS — BP 110/72 | HR 99 | Temp 99.0°F | Wt 167.0 lb

## 2012-01-25 DIAGNOSIS — J329 Chronic sinusitis, unspecified: Secondary | ICD-10-CM

## 2012-01-25 MED ORDER — AZITHROMYCIN 250 MG PO TABS: ORAL_TABLET | ORAL | Status: DC

## 2012-01-25 NOTE — Progress Notes (Signed)
  Subjective:    Patient ID: [name] Bowman, female    DOB: 12-16-1975, 37 y.o.   MRN: [id number]  HPI Here for 10 days of sinus pressure, ear pain, PND, and yellow mucus from the nose. No cough. Using Mucinex D.    Review of Systems  Constitutional: Negative.   HENT: Positive for congestion, postnasal drip and sinus pressure.   Eyes: Negative.   Respiratory: Negative.        Objective:   Physical Exam  Constitutional: She appears well-developed and well-nourished.  HENT:  Right Ear: External ear normal.  Left Ear: External ear normal.  Nose: Nose normal.  Mouth/Throat: Oropharynx is clear and moist.  Eyes: Conjunctivae normal are normal.  Pulmonary/Chest: Effort normal and breath sounds normal.  Lymphadenopathy:    She has no cervical adenopathy.          Assessment & Plan:  Out of work today until 01-29-12.

## 2012-02-26 ENCOUNTER — Other Ambulatory Visit: Payer: Self-pay

## 2012-02-28 ENCOUNTER — Other Ambulatory Visit (INDEPENDENT_AMBULATORY_CARE_PROVIDER_SITE_OTHER): Payer: Managed Care, Other (non HMO)

## 2012-02-28 DIAGNOSIS — Z0000 Encounter for general adult medical examination without abnormal findings: Secondary | ICD-10-CM

## 2012-02-28 LAB — LIPID PANEL
Cholesterol: 180 mg/dL (ref 0–200)
LDL Cholesterol: 100 mg/dL — ABNORMAL HIGH (ref 0–99)
VLDL: 17.4 mg/dL (ref 0.0–40.0)

## 2012-02-28 LAB — HEPATIC FUNCTION PANEL
ALT: 16 U/L (ref 0–35)
Alkaline Phosphatase: 45 U/L (ref 39–117)
Bilirubin, Direct: 0.1 mg/dL (ref 0.0–0.3)
Total Protein: 6.8 g/dL (ref 6.0–8.3)

## 2012-02-28 LAB — CBC WITH DIFFERENTIAL/PLATELET
Basophils Relative: 0.7 % (ref 0.0–3.0)
Eosinophils Relative: 2.3 % (ref 0.0–5.0)
Lymphocytes Relative: 45.9 % (ref 12.0–46.0)
Monocytes Relative: 5.5 % (ref 3.0–12.0)
Neutrophils Relative %: 45.6 % (ref 43.0–77.0)
RBC: 4.52 Mil/uL (ref 3.87–5.11)
WBC: 6 10*3/uL (ref 4.5–10.5)

## 2012-02-28 LAB — BASIC METABOLIC PANEL
CO2: 28 mEq/L (ref 19–32)
Calcium: 9 mg/dL (ref 8.4–10.5)
Creatinine, Ser: 0.7 mg/dL (ref 0.4–1.2)
GFR: 94.52 mL/min (ref >60.00)
Sodium: 140 mEq/L (ref 135–145)

## 2012-02-28 LAB — POCT URINALYSIS DIPSTICK
Ketones, UA: NEGATIVE
Protein, UA: NEGATIVE
Spec Grav, UA: 1.02
pH, UA: 6

## 2012-03-01 NOTE — Progress Notes (Signed)
Quick Note:  Results were released in my chart. ______ 

## 2012-03-07 ENCOUNTER — Encounter: Payer: Self-pay | Admitting: Family Medicine

## 2012-03-07 ENCOUNTER — Ambulatory Visit (INDEPENDENT_AMBULATORY_CARE_PROVIDER_SITE_OTHER): Payer: Managed Care, Other (non HMO) | Admitting: Family Medicine

## 2012-03-07 VITALS — BP 104/68 | HR 108 | Temp 98.2°F | Wt 171.0 lb

## 2012-03-07 DIAGNOSIS — Z0000 Encounter for general adult medical examination without abnormal findings: Secondary | ICD-10-CM

## 2012-03-07 DIAGNOSIS — Z23 Encounter for immunization: Secondary | ICD-10-CM

## 2012-03-07 MED ORDER — ZOLPIDEM TARTRATE 10 MG PO TABS: 10.0000 mg | ORAL_TABLET | Freq: Every evening | ORAL | Status: DC | PRN

## 2012-03-07 NOTE — Addendum Note (Signed)
Addended by: Aniceto Boss A on: 03/07/2012 10:21 AM   Modules accepted: Orders

## 2012-03-07 NOTE — Progress Notes (Signed)
  Subjective:    Patient ID: [name] Bowman, female    DOB: [birth year], 37 y.o.   MRN: [id number]  HPI 37 yr old female for a cpx. She feels good and has no concerns. 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    Review of Systems  Constitutional: Negative.   HENT: Negative.   Eyes: Negative.   Respiratory: Negative.   Cardiovascular: Negative.   Gastrointestinal: Negative.   Genitourinary: Negative for dysuria, urgency, frequency, hematuria, flank pain, decreased urine volume, enuresis, difficulty urinating, pelvic pain and dyspareunia.  Musculoskeletal: Negative.   Skin: Negative.   Neurological: Negative.   Psychiatric/Behavioral: Negative.        Objective:   Physical Exam  Constitutional: She is oriented to person, place, and time. She appears well-developed and well-nourished. No distress.  HENT:  Head: Normocephalic and atraumatic.  Right Ear: External ear normal.  Left Ear: External ear normal.  Nose: Nose normal.  Mouth/Throat: Oropharynx is clear and moist. No oropharyngeal exudate.  Eyes: Conjunctivae and EOM are normal. Pupils are equal, round, and reactive to light. No scleral icterus.  Neck: Normal range of motion. Neck supple. No JVD present. No thyromegaly present.  Cardiovascular: Normal rate, regular rhythm, normal heart sounds and intact distal pulses.  Exam reveals no gallop and no friction rub.   No murmur heard. Pulmonary/Chest: Effort normal and breath sounds normal. No respiratory distress. She has no wheezes. She has no rales. She exhibits no tenderness.  Abdominal: Soft. Bowel sounds are normal. She exhibits no distension and no mass. There is no tenderness. There is no rebound and no guarding.  Musculoskeletal: Normal range of motion. She exhibits no edema and no tenderness.  Lymphadenopathy:    She has no cervical adenopathy.  Neurological: She is alert and oriented to person, place, and time. She  has normal reflexes. No cranial nerve deficit. She exhibits normal muscle tone. Coordination normal.  Skin: Skin is warm and dry. No rash noted. No erythema.  Psychiatric: She has a normal mood and affect. Her behavior is normal. Judgment and thought content normal.          Assessment & Plan:  Well exam.

## 2012-05-05 ENCOUNTER — Ambulatory Visit (INDEPENDENT_AMBULATORY_CARE_PROVIDER_SITE_OTHER): Payer: Managed Care, Other (non HMO) | Admitting: Family Medicine

## 2012-05-05 ENCOUNTER — Encounter: Payer: Self-pay | Admitting: Family Medicine

## 2012-05-05 VITALS — BP 106/68 | HR 95 | Temp 98.6°F | Wt 169.0 lb

## 2012-05-05 DIAGNOSIS — S91311A Laceration without foreign body, right foot, initial encounter: Secondary | ICD-10-CM

## 2012-05-05 DIAGNOSIS — S91309A Unspecified open wound, unspecified foot, initial encounter: Secondary | ICD-10-CM

## 2012-05-05 NOTE — Progress Notes (Signed)
  Subjective:    Patient ID: [name] Bowman, female    DOB: [birth year], 37 y.o.   MRN: [id number]  HPI Here for an injury to the right foot at home last night. As she walked through a doorway barefoot she caught the 5th toe on the door frame. Since then she has had mild pain when walking on it. No swelling.    Review of Systems  Constitutional: Negative.   Skin: Positive for wound.       Objective:   Physical Exam  Constitutional: She appears well-developed and well-nourished. No distress.  Musculoskeletal:  The toes on the right foot appear intact, no swelling or ecchymosis. Good alignment. There is a small laceration to the webbing between the 4th and 5th toes          Assessment & Plan:  She has a small laceration between the toes. No sign of any joint or bone injury. She will clean it thoroughly this evening one time with hydrogen peroxide, thereafter clean it once a day with soap and water. Apply Neosporin 3-4 times a day. Recheck prn

## 2012-07-25 ENCOUNTER — Telehealth: Payer: Self-pay | Admitting: Family Medicine

## 2012-07-25 NOTE — Telephone Encounter (Signed)
Pt came in right after an eye doctor appointment at [facility] where she saw Dr. Allena Katz.  She has a left eye hemorrhage and the opthomologist wanted her to get labs done. She is possibly is leaving town (flying out) tomorrow, but would you be able to order a CBC, PT/PTT her BP was 100/65 in the office and RAS @ 2:18. Please advise. Thank you. Pt says she will just get labs done when she gets back, which may be about 9 days.

## 2012-07-25 NOTE — Telephone Encounter (Signed)
I spoke with Dr. Allena Katz and it will be okay for pt to get these labs done when she returns from trip. I also spoke with pt and she will schedule lab draw when she returns from trip.

## 2012-07-28 NOTE — Telephone Encounter (Signed)
If Dr. Hazle Quant wants her to have labs done, then HE needs to order these. I cannot order tests for a problem that I am not managing.

## 2012-07-28 NOTE — Telephone Encounter (Signed)
I left a voice message with below information. 

## 2012-08-18 ENCOUNTER — Other Ambulatory Visit: Payer: Self-pay | Admitting: Family Medicine

## 2012-09-01 ENCOUNTER — Ambulatory Visit (INDEPENDENT_AMBULATORY_CARE_PROVIDER_SITE_OTHER): Payer: Managed Care, Other (non HMO) | Admitting: Family Medicine

## 2012-09-01 ENCOUNTER — Encounter: Payer: Self-pay | Admitting: Family Medicine

## 2012-09-01 VITALS — BP 110/76 | HR 110 | Temp 98.6°F | Wt 162.0 lb

## 2012-09-01 DIAGNOSIS — J019 Acute sinusitis, unspecified: Secondary | ICD-10-CM

## 2012-09-01 MED ORDER — AZITHROMYCIN 250 MG PO TABS: ORAL_TABLET | ORAL | Status: DC

## 2012-09-01 NOTE — Progress Notes (Signed)
  Subjective:    Patient ID: [name] Bowman, female    DOB: [birth year], 37 y.o.   MRN: [id number]  HPI Here for one week of PND and a ST. No fever or cough. On Advil.    Review of Systems  Constitutional: Negative.   HENT: Positive for sore throat and postnasal drip. Negative for trouble swallowing.   Eyes: Negative.   Respiratory: Negative.        Objective:   Physical Exam  Constitutional: She appears well-developed and well-nourished.  HENT:  Right Ear: External ear normal.  Left Ear: External ear normal.  Nose: Nose normal.  Mouth/Throat: No oropharyngeal exudate.  Posterior OP is red   Eyes: Conjunctivae are normal.  Neck: Neck supple.  Pulmonary/Chest: Effort normal and breath sounds normal.  Lymphadenopathy:    She has no cervical adenopathy.          Assessment & Plan:  Recheck prn

## 2012-09-08 ENCOUNTER — Encounter: Payer: Self-pay | Admitting: Family Medicine

## 2012-09-12 MED ORDER — FLUCONAZOLE 150 MG PO TABS: 150.0000 mg | ORAL_TABLET | Freq: Once | ORAL | Status: DC | PRN

## 2012-10-25 ENCOUNTER — Telehealth: Payer: Self-pay | Admitting: Family Medicine

## 2012-10-25 NOTE — Telephone Encounter (Signed)
Pt is calling to request a 3 month supply of her zolpidem (AMBIEN) 10 MG tablet. She is requesting a [facility] month [facility] [address], and [facility]. Please assist.

## 2012-10-26 MED ORDER — ZOLPIDEM TARTRATE 10 MG PO TABS: 10.0000 mg | ORAL_TABLET | Freq: Every evening | ORAL | Status: DC | PRN

## 2012-10-26 NOTE — Telephone Encounter (Signed)
I faxed script to [facility] and called in a 30 day supply.

## 2012-10-26 NOTE — Telephone Encounter (Signed)
Call in #30 locally, also do #[facility] to [facility]

## 2012-11-16 ENCOUNTER — Other Ambulatory Visit: Payer: Self-pay

## 2012-12-06 ENCOUNTER — Ambulatory Visit: Payer: Managed Care, Other (non HMO) | Admitting: Family Medicine

## 2012-12-06 ENCOUNTER — Ambulatory Visit (INDEPENDENT_AMBULATORY_CARE_PROVIDER_SITE_OTHER): Payer: Managed Care, Other (non HMO) | Admitting: Family Medicine

## 2012-12-06 ENCOUNTER — Encounter: Payer: Self-pay | Admitting: Family Medicine

## 2012-12-06 VITALS — BP 122/78 | HR 98 | Temp 99.1°F | Wt 174.0 lb

## 2012-12-06 DIAGNOSIS — J019 Acute sinusitis, unspecified: Secondary | ICD-10-CM

## 2012-12-06 MED ORDER — AZITHROMYCIN 250 MG PO TABS: ORAL_TABLET | ORAL | Status: DC

## 2012-12-06 NOTE — Progress Notes (Signed)
Pre visit review using our clinic review tool, if applicable. No additional management support is needed unless otherwise documented below in the visit note. 

## 2012-12-06 NOTE — Progress Notes (Signed)
   Subjective:    Patient ID: [name] Bowman, female    DOB: [birth year], 37 y.o.   MRN: [id number]  HPI Here for 10 days of sinus pressure, PND, and a dry cough. On Mucinex    Review of Systems  Constitutional: Negative.   HENT: Positive for congestion, postnasal drip and sinus pressure.   Eyes: Negative.   Respiratory: Positive for cough.        Objective:   Physical Exam  Constitutional: She appears well-developed and well-nourished.  HENT:  Right Ear: External ear normal.  Left Ear: External ear normal.  Nose: Nose normal.  Mouth/Throat: Oropharynx is clear and moist.  Eyes: Conjunctivae are normal.  Pulmonary/Chest: Effort normal and breath sounds normal.  Lymphadenopathy:    She has no cervical adenopathy.          Assessment & Plan:  Drink fluids

## 2013-01-06 ENCOUNTER — Ambulatory Visit (INDEPENDENT_AMBULATORY_CARE_PROVIDER_SITE_OTHER): Payer: Managed Care, Other (non HMO) | Admitting: Family Medicine

## 2013-01-06 ENCOUNTER — Encounter: Payer: Self-pay | Admitting: Family Medicine

## 2013-01-06 VITALS — BP 112/84 | Temp 99.4°F | Wt 174.0 lb

## 2013-01-06 DIAGNOSIS — J019 Acute sinusitis, unspecified: Secondary | ICD-10-CM

## 2013-01-06 MED ORDER — ZOLPIDEM TARTRATE 10 MG PO TABS: 10.0000 mg | ORAL_TABLET | Freq: Every evening | ORAL | Status: DC | PRN

## 2013-01-06 MED ORDER — AMOXICILLIN-POT CLAVULANATE 875-125 MG PO TABS: 1.0000 | ORAL_TABLET | Freq: Two times a day (BID) | ORAL | Status: DC

## 2013-01-06 NOTE — Progress Notes (Signed)
   Subjective:    Patient ID: [name] Bowman, female    DOB: 10-25-1975, 37 y.o.   MRN: [id number]  HPI Here for 4 days of sinus pressure, PND, HA, and a dry cough. She took a Zpack for a sinusitis last month and it seemed to be resolved.    Review of Systems  Constitutional: Negative.   HENT: Positive for congestion, postnasal drip and sinus pressure.   Eyes: Negative.   Respiratory: Positive for cough.        Objective:   Physical Exam  Constitutional: She appears well-developed and well-nourished.  HENT:  Right Ear: External ear normal.  Left Ear: External ear normal.  Nose: Nose normal.  Mouth/Throat: Oropharynx is clear and moist. No oropharyngeal exudate.  Eyes: Conjunctivae are normal.  Pulmonary/Chest: Effort normal and breath sounds normal.  Lymphadenopathy:    She has no cervical adenopathy.          Assessment & Plan:  We will use Augmentin this time. Add Mucinex D

## 2013-01-06 NOTE — Progress Notes (Signed)
Pre visit review using our clinic review tool, if applicable. No additional management support is needed unless otherwise documented below in the visit note. 

## 2013-01-15 ENCOUNTER — Encounter: Payer: Self-pay | Admitting: Nurse Practitioner

## 2013-01-15 ENCOUNTER — Telehealth: Payer: Self-pay | Admitting: Family Medicine

## 2013-01-15 ENCOUNTER — Ambulatory Visit (INDEPENDENT_AMBULATORY_CARE_PROVIDER_SITE_OTHER): Payer: Managed Care, Other (non HMO) | Admitting: Nurse Practitioner

## 2013-01-15 VITALS — BP 120/70 | HR 108 | Temp 98.1°F | Ht 64.5 in | Wt 175.0 lb

## 2013-01-15 DIAGNOSIS — R51 Headache: Secondary | ICD-10-CM

## 2013-01-15 NOTE — Telephone Encounter (Signed)
Patient called stating she is still having a headache a little congestion  Stated she has a fever on Tue & Wed, patient is a flight attendant And wants to know if she needs to schedule appointment and keep taking the antibiotics Please advise

## 2013-01-15 NOTE — Patient Instructions (Signed)
Stop ibuprophen, as this headache may be r/t over-the-counter medication rebound headache. Alternatively, it could be r/t recent sinus infection. Start using mucinex D or just pseudoephedrine 30- 60 mg twice daily. Start sinus rinses daily. FCall for re-evaluation if headaches are persistent beyond 1 week or get worse. Headaches, Analgesic Rebound Analgesic agents are prescription or over-the-counter medications used to control pain, including headaches. However, overuse or misuse of theses medications can lead to rebound headaches. Rebound headaches are headaches that recur after the analgesic medication wears off. Eventually, the rebound headaches can become longlasting (chronic). If this happens, you must completely stop using analgesic medications. If not, the chronic headache is likely to continue despite the use of any other treatment. Usually when you stop taking analgesic medications, the headache may initally get worse for several days. Along with this you may experience sickness in your stomach (nausea), and you may throw up (vomit). After a period of 3 to 5 days, these symptoms begin to improve. Sometimes improvement may take longer. Eventually, the headaches will slowly improve with treatment with the right medications. Most people are able to stop using analgesic medications at home with a caregiver's supervision. But some find it difficult and may require hospitalization. Document Released: 03/20/2003 Document Revised: 03/22/2011 Document Reviewed: 07/13/2012 [facility] Patient Information 2014 [employer] [place].

## 2013-01-15 NOTE — Progress Notes (Signed)
Pre-visit discussion using our clinic review tool. No additional management support is needed unless otherwise documented below in the visit note.  

## 2013-01-15 NOTE — Telephone Encounter (Signed)
[facility]

## 2013-01-15 NOTE — Progress Notes (Signed)
   Subjective:    Patient ID: [name] BullionKimberly G Stockinger, female    DOB: 09/01/1975, 38 y.o.   MRN: [id number]  Headache  This is a new problem. The current episode started 1 to 4 weeks ago (1 wk). The problem has been waxing and waning. The pain is located in the frontal and occipital region. The pain does not radiate. The pain quality is similar to prior headaches. The quality of the pain is described as aching. The pain is moderate. Pertinent negatives include no abdominal pain, back pain, coughing, dizziness, fever or sore throat. Nothing aggravates the symptoms. She has tried NSAIDs for the symptoms. The treatment provided mild relief. Her past medical history is significant for sinus disease (recent tx fro sinusitis in Nov w/azithromycin-got better, then treated 2 weeks ago with augmentin. Nasal congestion improved, HA persistent.).      Review of Systems  Constitutional: Negative for fever, chills and fatigue.  HENT: Negative for congestion and sore throat.   Respiratory: Negative for cough, chest tightness and wheezing.   Cardiovascular: Negative for chest pain.  Gastrointestinal: Negative for abdominal pain.  Musculoskeletal: Negative for back pain.  Neurological: Positive for headaches. Negative for dizziness.  Hematological: Negative for adenopathy.       Objective:   Physical Exam  Vitals reviewed. Constitutional: She is oriented to person, place, and time. She appears well-developed and well-nourished. No distress.  HENT:  Head: Normocephalic and atraumatic.  Right Ear: External ear normal.  Left Ear: External ear normal.  Nose: Nose normal.  Mouth/Throat: Oropharynx is clear and moist. No oropharyngeal exudate.  Eyes: Conjunctivae are normal. Right eye exhibits no discharge. Left eye exhibits no discharge.  Neck: Normal range of motion. Neck supple. No thyromegaly present.  Cardiovascular: Normal rate, regular rhythm and normal heart sounds.   No murmur  heard. Pulmonary/Chest: Effort normal and breath sounds normal. No respiratory distress. She has no wheezes.  Lymphadenopathy:    She has no cervical adenopathy.  Neurological: She is alert and oriented to person, place, and time.  Skin: Skin is warm and dry.  Psychiatric: She has a normal mood and affect. Her behavior is normal. Thought content normal.          Assessment & Plan:  1. Headache(784.0) May be r/t sinusitis, analgesic rebound HA See pt instructions.

## 2013-02-19 ENCOUNTER — Ambulatory Visit (INDEPENDENT_AMBULATORY_CARE_PROVIDER_SITE_OTHER): Payer: Managed Care, Other (non HMO) | Admitting: Physician Assistant

## 2013-02-19 ENCOUNTER — Encounter: Payer: Self-pay | Admitting: Physician Assistant

## 2013-02-19 ENCOUNTER — Telehealth: Payer: Self-pay | Admitting: Gastroenterology

## 2013-02-19 ENCOUNTER — Ambulatory Visit: Payer: Managed Care, Other (non HMO) | Admitting: Family Medicine

## 2013-02-19 VITALS — BP 120/70 | HR 66 | Ht 63.75 in | Wt 172.8 lb

## 2013-02-19 DIAGNOSIS — K625 Hemorrhage of anus and rectum: Secondary | ICD-10-CM

## 2013-02-19 DIAGNOSIS — K648 Other hemorrhoids: Secondary | ICD-10-CM

## 2013-02-19 MED ORDER — HYDROCORTISONE ACETATE 25 MG RE SUPP: RECTAL | Status: DC

## 2013-02-19 NOTE — Telephone Encounter (Signed)
Moved OV to 2:30 PM.

## 2013-02-19 NOTE — Telephone Encounter (Signed)
Spoke with patient and she states yesterday when she had a bowel movement she had a lot of bright, red blood on tissue. Occurred again last night. Denies constipation but does describe that stools are smaller amounts than usual. Denies history of hemorrhoids. Scheduled with Mike GipAmy Esterwood, PA today at 1:30 PM.

## 2013-02-19 NOTE — Patient Instructions (Signed)
[facility] [address] We sent enough for 7 days and 1 refill if you need to repeat the course. . Anusol HC Suppositories.  Also get yourself a tube of Preperation H Over the counter hemorrhoid cream for external use, 1-2 times daily. Also get Over the counter Colace 100 mg to keep stools soft. Call us back if bleeding occurs.

## 2013-02-19 NOTE — Progress Notes (Signed)
Subjective:    Patient ID: [name] Bowman, female    DOB: 12/07/1975, 38 y.o.   MRN: [id number]  HPI  Macon LargeKimberley a pleasant 38 year old white female known to Dr. Jarold MottoPatterson from EGD done in 2010. At that time she had gastritis, tested H. pylori positive and was treated. She comes in today with a new problem of bright red blood per rectum. She says she had onset yesterday morning had a somewhat painful bowel movement followed by bright red blood noted on the tissue and then in the commode and "coating" her bowel movement. Last night she passed bright red blood again with the bowel movement but did not have any rectal discomfort associated and this morning had a bowel movement with out any blood noted. She says over the past couple of weeks she had a couple of episodes of small amounts of bright red blood noted on the tissue. Is no complaints of abdominal pain though states that she just started working out and has been doing sit-ups and has had a little bit of abdominal soreness from that. Family history is negative for colon cancer polyps or inflammatory bowel disease.    Review of Systems  Constitutional: Negative.   HENT: Negative.   Eyes: Negative.   Respiratory: Negative.   Cardiovascular: Negative.   Gastrointestinal: Positive for constipation and anal bleeding.  Endocrine: Negative.   Genitourinary: Negative.   Musculoskeletal: Negative.   Skin: Negative.   Allergic/Immunologic: Negative.   Neurological: Negative.   Hematological: Negative.   Psychiatric/Behavioral: Negative.    Outpatient Prescriptions Prior to Visit  Medication Sig Dispense Refill  . Biotin 5000 MCG CAPS Take 5,000 mcg by mouth 2 (two) times daily.      Marland Kitchen. ibuprofen (ADVIL,MOTRIN) 200 MG tablet Take 200 mg by mouth. 3 tabs by mouth as needed      . zolpidem (AMBIEN) 10 MG tablet Take 1 tablet (10 mg total) by mouth at bedtime as needed for sleep.  30 tablet  0  . amoxicillin-clavulanate (AUGMENTIN) 875-125  MG per tablet Take 1 tablet by mouth 2 (two) times daily.  20 tablet  0  . COLLAGEN PO Take by mouth 3 (three) times daily.      . Evening Primrose Oil 500 MG CAPS Take 500 mg by mouth 2 (two) times daily.       No facility-administered medications prior to visit.   Allergies  Allergen Reactions  . Sulfonamide Derivatives     REACTION: rash   Patient Active Problem List   Diagnosis Date Noted  . Edema 07/14/2011  . PATELLAR TENDINITIS 05/31/2008  . CONSTIPATION 05/16/2008  . GERD 05/13/2008  . HEADACHE 05/12/2007  . HYPERLIPIDEMIA 03/29/2007  . ANEMIA-NOS 03/29/2007  . ACUTE SINUSITIS, UNSPECIFIED 03/29/2007  . INSOMNIA 03/29/2007   family history includes Breast cancer in her maternal aunt, maternal grandmother, and mother; Diabetes in her father; Heart disease in her father; Ovarian cancer in her maternal aunt. There is no history of Colon cancer.     History  Substance Use Topics  . Smoking status: Never Smoker   . Smokeless tobacco: Never Used  . Alcohol Use: 0.5 oz/week    1 drink(s) per week     Comment: 1-2 week    Objective:   Physical Exam   well-developed white female in no acute distress, pleasant blood pressure 120/70 pulse 66 height 5 foot 3 weight 172. , HEENT; nontraumatic normocephalic EOMI PERRLA sclera anicteric, Supple; no JVD, Cardiovascular; regular rate and rhythm  with S1-S2 no murmur or gallop, Pulm;clear bilaterally, Abdomen; soft nontender nondistended bowel sounds are active there is no palpable mass or hepatosplenomegaly, Rectal; exam externally there is a tiny shallow tear in the posterior anus on anoscopy she's really nontender and there is 1 friable internal hemorrhoid ,no blood in the rectal vault or on the endoscope, Extremities; no clubbing cyanosis or edema skin warm and dry, Psych; mood and affect normal and appropriate        Assessment & Plan:   #32  38 year old female with bright red blood per x1 day, resolved today. Anoscopy shows  internal hemorrhoid which is friable and is the likely source of her bleeding. She also has a tiny anal fissure   Plan; Anusol-HC suppositories at bedtime x7 days then repeat as needed Patient advised to increase her fluid intake and fiber intake. To also start Colace once daily over the next week or so to prevent constipation Do not think the tiny fissure was the source for her bleeding. She is advised to call back should bleeding recur over the next few months and at that time may need to consider colonoscopy

## 2013-02-22 ENCOUNTER — Ambulatory Visit: Payer: Managed Care, Other (non HMO) | Admitting: Physician Assistant

## 2013-07-27 ENCOUNTER — Encounter: Payer: Self-pay | Admitting: Family Medicine

## 2013-07-27 ENCOUNTER — Ambulatory Visit (INDEPENDENT_AMBULATORY_CARE_PROVIDER_SITE_OTHER): Payer: Managed Care, Other (non HMO) | Admitting: Family Medicine

## 2013-07-27 VITALS — BP 114/78 | HR 98 | Temp 98.8°F | Ht 63.75 in | Wt 176.0 lb

## 2013-07-27 DIAGNOSIS — R0789 Other chest pain: Secondary | ICD-10-CM

## 2013-07-27 DIAGNOSIS — IMO0001 Reserved for inherently not codable concepts without codable children: Secondary | ICD-10-CM

## 2013-07-27 DIAGNOSIS — K219 Gastro-esophageal reflux disease without esophagitis: Secondary | ICD-10-CM

## 2013-07-27 DIAGNOSIS — J00 Acute nasopharyngitis [common cold]: Secondary | ICD-10-CM

## 2013-07-27 NOTE — Patient Instructions (Signed)
GERD: -start prilosec 20mg  daily for 1 month  -We placed a referral for you as discussed for the stress test. It usually takes about 1-2 weeks to process and schedule this referral. If you have not heard from us regarding this appointment in 2 weeks please contact our office.  For the cold symptoms: -afrin for 4 days, nasocort for 4 weeks  Follow up if worsening or persisting or any concerns and in 1 month

## 2013-07-27 NOTE — Progress Notes (Signed)
No chief complaint on file.   HPI:  URI: -started: 10 days ago -symptoms:nasal congestion, sore throat, cough - this is improving -denies:fever, SOB, NVD, tooth pain -has tried: decongestant -sick contacts/travel/risks: denies flu exposure, tick exposure or or Ebola risks -Hx of: GERD  GERD: -heartburn after eating spicy foods the last few days and starting to drink coffee again -heartburn, acid in throat -denies: chest pressure, SOB, wheezing, palpitations, dysphagia   ROS: See pertinent positives and negatives per HPI.  Past Medical History  Diagnosis Date  . HYPERLIPIDEMIA 03/29/2007  . ANEMIA-NOS 03/29/2007  . GERD 05/13/2008  . CONSTIPATION 05/16/2008  . Patellar tendinitis 05/31/2008  . INSOMNIA 03/29/2007  . Headache(784.0) 05/12/2007  . 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 Kenna GilbertMcGkin in 2009 with ECHO (benign), then saw Dr. Merrily Pewarryl Kalil for exam (benign)    Past Surgical History  Procedure Laterality Date  . Hammer toe surgery  2012    right 4th and 5th toes    Family History  Problem Relation Age of Onset  . Breast cancer Mother   . Breast cancer Maternal Grandmother   . Breast cancer Maternal Aunt   . Ovarian cancer Maternal Aunt   . Diabetes Father   . Heart disease Father   . Colon cancer Neg Hx     History   Social History  . Marital Status: Single    Spouse Name: N/A    Number of Children: 0  . Years of Education: N/A   Occupational History  . flight attendant    Social History Main Topics  . Smoking status: Never Smoker   . Smokeless tobacco: Never Used  . Alcohol Use: 0.5 oz/week    1 drink(s) per week     Comment: 1-2 week  . Drug Use: No  . Sexual Activity: None   Other Topics Concern  . None   Social History Narrative  . None    Current outpatient prescriptions:Biotin 5000 MCG CAPS, Take 5,000 mcg by mouth 2 (two) times daily., Disp: , Rfl: ;  Multiple Vitamin (MULTIVITAMINS PO), Take by mouth., Disp: , Rfl: ;  Probiotic  Product (PROBIOTIC DAILY PO), Take by mouth., Disp: , Rfl: ;  zolpidem (AMBIEN) 10 MG tablet, Take 1 tablet (10 mg total) by mouth at bedtime as needed for sleep., Disp: 30 tablet, Rfl: 0  EXAM:  Filed Vitals:   07/27/13 1551  BP: 114/78  Pulse: 98  Temp: 98.8 F (37.1 C)    Body mass index is 30.46 kg/(m^2).  GENERAL: vitals reviewed and listed above, alert, oriented, appears well hydrated and in no acute distress  HEENT: atraumatic, conjunttiva clear, no obvious abnormalities on inspection of external nose and ears, normal appearance of ear canals and TMs, clear nasal congestion, mild post oropharyngeal erythema with PND, no tonsillar edema or exudate, no sinus TTP  NECK: no obvious masses on inspection  LUNGS: clear to auscultation bilaterally, no wheezes, rales or rhonchi, good air movement  CV: HRRR, no peripheral edema  MS: moves all extremities without noticeable abnormality  PSYCH: pleasant and cooperative, no obvious depression or anxiety  ASSESSMENT AND PLAN:  Discussed the following assessment and plan:  Gastroesophageal reflux disease, esophagitis presence not specified  Atypical chest pain - Plan: Exercise Tolerance Test, EKG 12-Lead  Cold  -atypical chest pain most likely GERD - start prilosec 20 mg dialy, ekg ok, ese -flonase and afrin for sinus issues likely viral or result of gerd or allergies -follow up  in 1 month -of course, we advised to return or notify a doctor immediately if symptoms worsen or persist or new concerns arise.    Patient Instructions  GERD: -start prilosec 20mg  daily for 1 month  -We placed a referral for you as discussed for the stress test. It usually takes about 1-2 weeks to process and schedule this referral. If you have not heard from [place] regarding this appointment in 2 weeks please contact our office.  For the cold symptoms: -afrin for 4 days, nasocort for 4 weeks  Follow up if worsening or persisting or any concerns and  in 1 month      Davey Bergsma R.

## 2013-07-27 NOTE — Progress Notes (Signed)
Pre visit review using our clinic review tool, if applicable. No additional management support is needed unless otherwise documented below in the visit note. 

## 2013-08-08 ENCOUNTER — Telehealth: Payer: Self-pay | Admitting: Family Medicine

## 2013-08-08 MED ORDER — ZOLPIDEM TARTRATE 10 MG PO TABS: 10.0000 mg | ORAL_TABLET | Freq: Every evening | ORAL | Status: DC | PRN

## 2013-08-08 NOTE — Telephone Encounter (Signed)
Ready to be faxed.

## 2013-08-08 NOTE — Telephone Encounter (Signed)
Pt request refill of the following: zolpidem (AMBIEN) 10 MG tablet    Phamacy:   [facility]

## 2013-08-09 NOTE — Telephone Encounter (Signed)
Script was faxed.

## 2013-08-20 ENCOUNTER — Ambulatory Visit (INDEPENDENT_AMBULATORY_CARE_PROVIDER_SITE_OTHER): Payer: Managed Care, Other (non HMO) | Admitting: Nurse Practitioner

## 2013-08-20 ENCOUNTER — Encounter: Payer: Self-pay | Admitting: Nurse Practitioner

## 2013-08-20 VITALS — BP 133/72 | HR 89

## 2013-08-20 DIAGNOSIS — Z9289 Personal history of other medical treatment: Secondary | ICD-10-CM

## 2013-08-20 DIAGNOSIS — R0789 Other chest pain: Secondary | ICD-10-CM

## 2013-08-20 HISTORY — DX: Personal history of other medical treatment: Z92.89

## 2013-08-20 NOTE — Progress Notes (Signed)
Exercise Treadmill Test  Pre-Exercise Testing Evaluation Rhythm: normal sinus  Rate: 81     Test  Exercise Tolerance Test Ordering MD: Charlton HawsPeter Nishan, MD  Interpreting MD: Norma FredricksonLori Gerhardt, NP  Unique Test No: 1  Treadmill:  1  Indication for ETT: chest pain - rule out ischemia  Contraindication to ETT: No   Stress Modality: exercise - treadmill  Cardiac Imaging Performed: non   Protocol: standard Bruce - maximal  Max BP:  156/73  Max MPHR (bpm):  180 85% MPR (bpm):  153  MPHR obtained (bpm):  164 % MPHR obtained:  90%  Reached 85% MPHR (min:sec):  6:08 Total Exercise Time (min-sec):  8:00  Workload in METS:  10.0 Borg Scale: 13  Reason ETT Terminated:  desired heart rate attained    ST Segment Analysis At Rest: normal ST segments - no evidence of significant ST depression With Exercise: no evidence of significant ST depression  Other Information Arrhythmia:  Yes Angina during ETT:  absent (0) Quality of ETT:  diagnostic  ETT Interpretation:  normal - no evidence of ischemia by ST analysis  Comments: Patient presents today for routine GXT. Has had atypical chest pain. FH positive for CAD. Has HLD - no HTN.  Past evaluation for PVCs - negative work up noted.   Today the patient exercised on the standard Bruce protocol for a total of 8 minutes. Test stopped prematurely by operator.  Good exercise tolerance.  Adequate blood pressure response.  Clinically negative for chest pain. Test was stopped due to achievement of target HR.  EKG negative for ischemia. No significant arrhythmia noted but did have occasional PVCs.   Recommendations: CV risk factor modification.  See back prn.  Patient is agreeable to this plan and will call if any problems develop in the interim.   [name] MacadamiaLori C. Gerhardt, RN, ANP-C [facility] [address] [place], [place]  [id number] [phone]

## 2013-08-27 ENCOUNTER — Encounter: Payer: Self-pay | Admitting: Family Medicine

## 2013-08-27 ENCOUNTER — Ambulatory Visit (INDEPENDENT_AMBULATORY_CARE_PROVIDER_SITE_OTHER): Payer: Managed Care, Other (non HMO) | Admitting: Family Medicine

## 2013-08-27 VITALS — BP 110/78 | HR 82 | Temp 98.2°F | Ht 63.75 in | Wt 172.5 lb

## 2013-08-27 DIAGNOSIS — R609 Edema, unspecified: Secondary | ICD-10-CM

## 2013-08-27 DIAGNOSIS — K219 Gastro-esophageal reflux disease without esophagitis: Secondary | ICD-10-CM

## 2013-08-27 NOTE — Progress Notes (Signed)
No chief complaint on file.   HPI:  [name] Bowman is a pleasant 38 yo F patient of Dr. 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  Past Medical History  Diagnosis Date  . HYPERLIPIDEMIA 03/29/2007  . ANEMIA-NOS 03/29/2007  . GERD 05/13/2008  . CONSTIPATION 05/16/2008  . Patellar tendinitis 05/31/2008  . INSOMNIA 03/29/2007  . Headache(784.0) 05/12/2007  . 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 Kenna GilbertMcGkin in 2009 with ECHO (benign), then saw Dr. Merrily Pewarryl Kalil for exam (benign)    Past Surgical History  Procedure Laterality Date  . Hammer toe surgery  2012    right 4th and 5th toes    Family History  Problem Relation Age of Onset  . Breast cancer Mother   . Breast cancer Maternal Grandmother   . Breast cancer Maternal Aunt   . Ovarian cancer Maternal Aunt   . Diabetes Father   . Heart disease Father [phone]  . Colon cancer Neg Hx     History   Social History  . Marital Status: Single    Spouse Name: N/A    Number of Children: 0  . Years of Education: N/A   Occupational History  . flight attendant    Social History Main Topics  . Smoking status: Never Smoker   . Smokeless tobacco: Never Used  . Alcohol Use: 0.5 oz/week    1 drink(s) per week     Comment: 1-2 week  . Drug Use: No  . Sexual Activity: Yes   Other Topics Concern  . None   Social History Narrative  . None    Current outpatient prescriptions:Multiple Vitamin  (MULTIVITAMINS PO), Take by mouth., Disp: , Rfl: ;  Probiotic Product (PROBIOTIC DAILY PO), Take by mouth., Disp: , Rfl: ;  zolpidem (AMBIEN) 10 MG tablet, Take 1 tablet (10 mg total) by mouth at bedtime as needed for sleep., Disp: 90 tablet, Rfl: 1  EXAM:  Filed Vitals:   08/27/13 1406  BP: 110/78  Pulse: 82  Temp: 98.2 F (36.8 C)    Body mass index is 29.85 kg/(m^2).  GENERAL: vitals reviewed and listed above, alert, oriented, appears well hydrated and in no acute distress  HEENT: atraumatic, conjunttiva clear, no obvious abnormalities on inspection of external nose and ears  NECK: no obvious masses on inspection  LUNGS: clear to auscultation bilaterally, no wheezes, rales or rhonchi, good air movement  CV: HRRR, no peripheral edema  MS: moves all extremities without noticeable abnormality  PSYCH: pleasant and cooperative, no obvious depression or anxiety  ASSESSMENT AND PLAN:  Discussed the following assessment and plan:  Gastroesophageal reflux disease, esophagitis presence not specified  Edema  -improved and symptoms resolved - trial of stopping PPI, continue lifestyle recs -rx for refill compression stockings provided -follow up with PCP in 3 months or sooner as needed -Patient advised to return or notify a doctor immediately if symptoms worsen or persist or new concerns arise.  There are no  Patient Instructions on file for this visit.   Kriste Basque R.

## 2013-08-27 NOTE — Progress Notes (Signed)
Pre visit review using our clinic review tool, if applicable. No additional management support is needed unless otherwise documented below in the visit note. 

## 2013-10-03 ENCOUNTER — Other Ambulatory Visit: Payer: Managed Care, Other (non HMO)

## 2013-10-10 ENCOUNTER — Encounter: Payer: Managed Care, Other (non HMO) | Admitting: Family Medicine

## 2013-11-07 ENCOUNTER — Other Ambulatory Visit (INDEPENDENT_AMBULATORY_CARE_PROVIDER_SITE_OTHER): Payer: Managed Care, Other (non HMO)

## 2013-11-07 DIAGNOSIS — Z0000 Encounter for general adult medical examination without abnormal findings: Secondary | ICD-10-CM

## 2013-11-07 LAB — CBC WITH DIFFERENTIAL/PLATELET
Basophils Absolute: 0 10*3/uL (ref 0.0–0.1)
Basophils Relative: 0.7 % (ref 0.0–3.0)
EOS ABS: 0.1 10*3/uL (ref 0.0–0.7)
Eosinophils Relative: 2.1 % (ref 0.0–5.0)
HCT: 42.9 % (ref 36.0–46.0)
Hemoglobin: 14.3 g/dL (ref 12.0–15.0)
LYMPHS PCT: 46.7 % — AB (ref 12.0–46.0)
Lymphs Abs: 2.7 10*3/uL (ref 0.7–4.0)
MCHC: 33.3 g/dL (ref 30.0–36.0)
MCV: 94.2 fl (ref 78.0–100.0)
Monocytes Absolute: 0.4 10*3/uL (ref 0.1–1.0)
Monocytes Relative: 6.4 % (ref 3.0–12.0)
NEUTROS PCT: 44.1 % (ref 43.0–77.0)
Neutro Abs: 2.5 10*3/uL (ref 1.4–7.7)
PLATELETS: 314 10*3/uL (ref 150.0–400.0)
RBC: 4.56 Mil/uL (ref 3.87–5.11)
RDW: 12.8 % (ref 11.5–15.5)
WBC: 5.7 10*3/uL (ref 4.0–10.5)

## 2013-11-07 LAB — LIPID PANEL
Cholesterol: 192 mg/dL (ref 0–200)
HDL: 46.1 mg/dL (ref >39.00)
LDL CALC: 127 mg/dL — AB (ref 0–99)
NONHDL: 145.9
Total CHOL/HDL Ratio: 4
Triglycerides: 94 mg/dL (ref 0.0–149.0)
VLDL: 18.8 mg/dL (ref 0.0–40.0)

## 2013-11-07 LAB — POCT URINALYSIS DIPSTICK
Bilirubin, UA: NEGATIVE
Blood, UA: NEGATIVE
Glucose, UA: NEGATIVE
Leukocytes, UA: NEGATIVE
NITRITE UA: NEGATIVE
PROTEIN UA: NEGATIVE
Spec Grav, UA: 1.01
UROBILINOGEN UA: 1
pH, UA: 5.5

## 2013-11-07 LAB — HEPATIC FUNCTION PANEL
ALK PHOS: 34 U/L — AB (ref 39–117)
ALT: 17 U/L (ref 0–35)
AST: 11 U/L (ref 0–37)
Albumin: 3.5 g/dL (ref 3.5–5.2)
BILIRUBIN DIRECT: 0.1 mg/dL (ref 0.0–0.3)
BILIRUBIN TOTAL: 0.5 mg/dL (ref 0.2–1.2)
TOTAL PROTEIN: 6.5 g/dL (ref 6.0–8.3)

## 2013-11-07 LAB — BASIC METABOLIC PANEL
BUN: 21 mg/dL (ref 6–23)
CALCIUM: 8.8 mg/dL (ref 8.4–10.5)
CO2: 22 meq/L (ref 19–32)
CREATININE: 0.8 mg/dL (ref 0.4–1.2)
Chloride: 103 mEq/L (ref 96–112)
GFR: 79.69 mL/min (ref >60.00)
Glucose, Bld: 90 mg/dL (ref 70–99)
Potassium: 4 mEq/L (ref 3.5–5.1)
Sodium: 137 mEq/L (ref 135–145)

## 2013-11-07 LAB — TSH: TSH: 0.9 u[IU]/mL (ref 0.35–4.50)

## 2013-11-07 NOTE — Addendum Note (Signed)
Addended by: Alfred LevinsWYRICK, CINDY D on: 11/07/2013 11:39 AM   Modules accepted: Orders

## 2013-11-13 ENCOUNTER — Encounter: Payer: Self-pay | Admitting: Family Medicine

## 2013-11-13 ENCOUNTER — Ambulatory Visit (INDEPENDENT_AMBULATORY_CARE_PROVIDER_SITE_OTHER): Payer: Managed Care, Other (non HMO) | Admitting: Family Medicine

## 2013-11-13 VITALS — BP 101/67 | HR 77 | Temp 99.0°F | Ht 63.75 in | Wt 164.0 lb

## 2013-11-13 DIAGNOSIS — Z23 Encounter for immunization: Secondary | ICD-10-CM

## 2013-11-13 DIAGNOSIS — Z0000 Encounter for general adult medical examination without abnormal findings: Secondary | ICD-10-CM

## 2013-11-13 NOTE — Progress Notes (Signed)
Pre visit review using our clinic review tool, if applicable. No additional management support is needed unless otherwise documented below in the visit note. 

## 2013-11-13 NOTE — Addendum Note (Signed)
Addended by: Aniceto BossNIMMONS, SYLVIA A on: 11/13/2013 04:26 PM   Modules accepted: Orders

## 2013-11-13 NOTE — Progress Notes (Signed)
   Subjective:    Patient ID: Dorise BullionKimberly G Sarr, female    DOB: 10/16/1975, 38 y.o.   MRN: [id number]  HPI 38 yr old female for a cpx. She feels well. She recently went on a low carb diet.    Review of Systems  Constitutional: Negative.   HENT: Negative.   Eyes: Negative.   Respiratory: Negative.   Cardiovascular: Negative.   Gastrointestinal: Negative.   Genitourinary: Negative for dysuria, urgency, frequency, hematuria, flank pain, decreased urine volume, enuresis, difficulty urinating, pelvic pain and dyspareunia.  Musculoskeletal: Negative.   Skin: Negative.   Neurological: Negative.   Psychiatric/Behavioral: Negative.        Objective:   Physical Exam  Constitutional: She is oriented to person, place, and time. She appears well-developed and well-nourished. No distress.  HENT:  Head: Normocephalic and atraumatic.  Right Ear: External ear normal.  Left Ear: External ear normal.  Nose: Nose normal.  Mouth/Throat: Oropharynx is clear and moist. No oropharyngeal exudate.  Eyes: Conjunctivae and EOM are normal. Pupils are equal, round, and reactive to light. No scleral icterus.  Neck: Normal range of motion. Neck supple. No JVD present. No thyromegaly present.  Cardiovascular: Normal rate, regular rhythm, normal heart sounds and intact distal pulses.  Exam reveals no gallop and no friction rub.   No murmur heard. Pulmonary/Chest: Effort normal and breath sounds normal. No respiratory distress. She has no wheezes. She has no rales. She exhibits no tenderness.  Abdominal: Soft. Bowel sounds are normal. She exhibits no distension and no mass. There is no tenderness. There is no rebound and no guarding.  Musculoskeletal: Normal range of motion. She exhibits no edema or tenderness.  Lymphadenopathy:    She has no cervical adenopathy.  Neurological: She is alert and oriented to person, place, and time. She has normal reflexes. No cranial nerve deficit. She exhibits normal muscle  tone. Coordination normal.  Skin: Skin is warm and dry. No rash noted. No erythema.  Psychiatric: She has a normal mood and affect. Her behavior is normal. Judgment and thought content normal.          Assessment & Plan:  well exam.

## 2013-12-20 ENCOUNTER — Encounter: Payer: Self-pay | Admitting: Family Medicine

## 2013-12-21 MED ORDER — ZOLPIDEM TARTRATE ER 12.5 MG PO TBCR: 12.5000 mg | EXTENDED_RELEASE_TABLET | Freq: Every evening | ORAL | Status: DC | PRN

## 2013-12-21 NOTE — Telephone Encounter (Signed)
Stop Zolpidem and switch to Ambien CR 12.5 mg to take qhs prn sleep, 6 month supply

## 2013-12-21 NOTE — Telephone Encounter (Signed)
I did call in new script and left a message for pt with this information.

## 2014-05-14 ENCOUNTER — Other Ambulatory Visit: Payer: Self-pay | Admitting: Obstetrics and Gynecology

## 2014-05-14 DIAGNOSIS — Z803 Family history of malignant neoplasm of breast: Secondary | ICD-10-CM

## 2014-06-27 ENCOUNTER — Encounter: Payer: Self-pay | Admitting: Family Medicine

## 2014-06-27 NOTE — Telephone Encounter (Signed)
Refill for 6 months. 

## 2014-06-28 MED ORDER — ZOLPIDEM TARTRATE ER 12.5 MG PO TBCR: 12.5000 mg | EXTENDED_RELEASE_TABLET | Freq: Every evening | ORAL | Status: DC | PRN

## 2014-07-19 ENCOUNTER — Other Ambulatory Visit: Payer: Managed Care, Other (non HMO)

## 2014-07-30 ENCOUNTER — Inpatient Hospital Stay: Admission: RE | Admit: 2014-07-30 | Payer: Managed Care, Other (non HMO) | Source: Ambulatory Visit

## 2014-07-31 ENCOUNTER — Ambulatory Visit
Admission: RE | Admit: 2014-07-31 | Discharge: 2014-07-31 | Disposition: A | Discharge status: Home / Self Care | Payer: BLUE CROSS/BLUE SHIELD | Source: Ambulatory Visit | Attending: Obstetrics and Gynecology | Admitting: Obstetrics and Gynecology

## 2014-07-31 DIAGNOSIS — Z803 Family history of malignant neoplasm of breast: Secondary | ICD-10-CM

## 2014-07-31 MED ORDER — GADOBENATE DIMEGLUMINE 529 MG/ML IV SOLN
13.0000 mL | Freq: Once | INTRAVENOUS | Status: AC | PRN
  Administered 2014-07-31: 13 mL via INTRAVENOUS

## 2014-12-12 ENCOUNTER — Encounter: Payer: Self-pay | Admitting: Gastroenterology

## 2015-01-19 ENCOUNTER — Encounter: Payer: Self-pay | Admitting: Family Medicine

## 2015-01-20 MED ORDER — ZOLPIDEM TARTRATE 10 MG PO TABS: 10.0000 mg | ORAL_TABLET | Freq: Every evening | ORAL | Status: DC | PRN

## 2015-01-20 NOTE — Telephone Encounter (Signed)
Call in Zolpidem 10 mg qhs, #30 with 5 rf 

## 2015-04-16 DIAGNOSIS — M5441 Lumbago with sciatica, right side: Secondary | ICD-10-CM | POA: Diagnosis not present

## 2015-04-16 DIAGNOSIS — G8929 Other chronic pain: Secondary | ICD-10-CM | POA: Diagnosis not present

## 2015-04-28 DIAGNOSIS — G8929 Other chronic pain: Secondary | ICD-10-CM | POA: Diagnosis not present

## 2015-04-28 DIAGNOSIS — M5441 Lumbago with sciatica, right side: Secondary | ICD-10-CM | POA: Diagnosis not present

## 2015-05-05 DIAGNOSIS — M5441 Lumbago with sciatica, right side: Secondary | ICD-10-CM | POA: Diagnosis not present

## 2015-05-05 DIAGNOSIS — G8929 Other chronic pain: Secondary | ICD-10-CM | POA: Diagnosis not present

## 2015-05-08 ENCOUNTER — Encounter: Payer: Self-pay | Admitting: Family Medicine

## 2015-05-08 ENCOUNTER — Ambulatory Visit (INDEPENDENT_AMBULATORY_CARE_PROVIDER_SITE_OTHER): Payer: BLUE CROSS/BLUE SHIELD | Admitting: Family Medicine

## 2015-05-08 VITALS — BP 108/87 | HR 85 | Temp 98.7°F | Ht 63.75 in | Wt 157.0 lb

## 2015-05-08 DIAGNOSIS — S41111A Laceration without foreign body of right upper arm, initial encounter: Secondary | ICD-10-CM | POA: Diagnosis not present

## 2015-05-08 MED ORDER — AMOXICILLIN-POT CLAVULANATE 875-125 MG PO TABS: 1.0000 | ORAL_TABLET | Freq: Two times a day (BID) | ORAL | Status: DC

## 2015-05-08 NOTE — Progress Notes (Signed)
   Subjective:    Patient ID: [name] Bowman, female    DOB: 06/30/1975, 40 y.o.   MRN: [id number]  HPI Here for a wound to the right forearm that occurred at home yesterday. While working with a power drill on a board, her hand slipped and the tip of the drill penetrated the right forearm. She washed it out with water and applied an herbal antibacterial ointment. Today the area is sore but not painful.   Review of Systems  Constitutional: Negative.   Skin: Positive for wound.       Objective:   Physical Exam  Constitutional: She appears well-developed and well-nourished.  Skin:  The inner right forearm has a superficial puncture wound which looks clean. It does not appear to have damaged any tendons, etc. She has full ROM of the hand. No swelling or erythema. Mildly tender           Assessment & Plan:  Puncture wound. Cover for infection with Augmentin. Nelwyn SalisburyFRY,Vicente Weidler A, MD

## 2015-05-08 NOTE — Progress Notes (Signed)
Pre visit review using our clinic review tool, if applicable. No additional management support is needed unless otherwise documented below in the visit note. 

## 2015-05-20 DIAGNOSIS — M545 Low back pain: Secondary | ICD-10-CM | POA: Diagnosis not present

## 2015-05-22 DIAGNOSIS — M545 Low back pain: Secondary | ICD-10-CM | POA: Diagnosis not present

## 2015-05-23 ENCOUNTER — Other Ambulatory Visit (INDEPENDENT_AMBULATORY_CARE_PROVIDER_SITE_OTHER): Payer: BLUE CROSS/BLUE SHIELD

## 2015-05-23 DIAGNOSIS — Z0000 Encounter for general adult medical examination without abnormal findings: Secondary | ICD-10-CM | POA: Diagnosis not present

## 2015-05-23 LAB — LIPID PANEL
Cholesterol: 272 mg/dL — ABNORMAL HIGH (ref 0–200)
HDL: 55.4 mg/dL (ref >39.00)
LDL Cholesterol: 202 mg/dL — ABNORMAL HIGH (ref 0–99)
NonHDL: 216.97
Total CHOL/HDL Ratio: 5
Triglycerides: 77 mg/dL (ref 0.0–149.0)
VLDL: 15.4 mg/dL (ref 0.0–40.0)

## 2015-05-23 LAB — CBC WITH DIFFERENTIAL/PLATELET
Basophils Absolute: 0 10*3/uL (ref 0.0–0.1)
Basophils Relative: 0.9 % (ref 0.0–3.0)
EOS PCT: 3.7 % (ref 0.0–5.0)
Eosinophils Absolute: 0.2 10*3/uL (ref 0.0–0.7)
HEMATOCRIT: 40.1 % (ref 36.0–46.0)
HEMOGLOBIN: 13.7 g/dL (ref 12.0–15.0)
LYMPHS PCT: 49.2 % — AB (ref 12.0–46.0)
Lymphs Abs: 2.2 10*3/uL (ref 0.7–4.0)
MCHC: 34.3 g/dL (ref 30.0–36.0)
MCV: 92.4 fl (ref 78.0–100.0)
MONO ABS: 0.3 10*3/uL (ref 0.1–1.0)
MONOS PCT: 5.9 % (ref 3.0–12.0)
Neutro Abs: 1.8 10*3/uL (ref 1.4–7.7)
Neutrophils Relative %: 40.3 % — ABNORMAL LOW (ref 43.0–77.0)
Platelets: 325 10*3/uL (ref 150.0–400.0)
RBC: 4.34 Mil/uL (ref 3.87–5.11)
RDW: 11.9 % (ref 11.5–15.5)
WBC: 4.4 10*3/uL (ref 4.0–10.5)

## 2015-05-23 LAB — HEPATIC FUNCTION PANEL
ALK PHOS: 32 U/L — AB (ref 39–117)
ALT: 10 U/L (ref 0–35)
AST: 12 U/L (ref 0–37)
Albumin: 4.4 g/dL (ref 3.5–5.2)
BILIRUBIN TOTAL: 0.7 mg/dL (ref 0.2–1.2)
Bilirubin, Direct: 0.1 mg/dL (ref 0.0–0.3)
Total Protein: 6.7 g/dL (ref 6.0–8.3)

## 2015-05-23 LAB — POC URINALSYSI DIPSTICK (AUTOMATED)
BILIRUBIN UA: NEGATIVE
Blood, UA: NEGATIVE
GLUCOSE UA: NEGATIVE
LEUKOCYTES UA: NEGATIVE
NITRITE UA: NEGATIVE
Protein, UA: NEGATIVE
Spec Grav, UA: 1.025
Urobilinogen, UA: 0.2
pH, UA: 5

## 2015-05-23 LAB — TSH: TSH: 1.04 u[IU]/mL (ref 0.35–4.50)

## 2015-05-23 LAB — BASIC METABOLIC PANEL
BUN: 20 mg/dL (ref 6–23)
CHLORIDE: 103 meq/L (ref 96–112)
CO2: 24 mEq/L (ref 19–32)
Calcium: 9.3 mg/dL (ref 8.4–10.5)
Creatinine, Ser: 0.79 mg/dL (ref 0.40–1.20)
GFR: 84.89 mL/min (ref >60.00)
Glucose, Bld: 80 mg/dL (ref 70–99)
POTASSIUM: 4.1 meq/L (ref 3.5–5.1)
SODIUM: 137 meq/L (ref 135–145)

## 2015-05-27 ENCOUNTER — Telehealth: Payer: Self-pay | Admitting: Family Medicine

## 2015-05-27 DIAGNOSIS — M545 Low back pain: Secondary | ICD-10-CM | POA: Diagnosis not present

## 2015-05-27 NOTE — Telephone Encounter (Signed)
Pt returning your call concerning labs Pt sees Dr Clent RidgesFry

## 2015-05-27 NOTE — Telephone Encounter (Signed)
See result note.  

## 2015-06-06 ENCOUNTER — Encounter: Payer: BLUE CROSS/BLUE SHIELD | Admitting: Family Medicine

## 2015-06-16 DIAGNOSIS — M545 Low back pain: Secondary | ICD-10-CM | POA: Diagnosis not present

## 2015-06-25 ENCOUNTER — Ambulatory Visit (INDEPENDENT_AMBULATORY_CARE_PROVIDER_SITE_OTHER): Payer: BLUE CROSS/BLUE SHIELD | Admitting: Family Medicine

## 2015-06-25 ENCOUNTER — Encounter: Payer: Self-pay | Admitting: Family Medicine

## 2015-06-25 VITALS — BP 98/72 | HR 79 | Temp 98.8°F | Ht 63.75 in | Wt 161.0 lb

## 2015-06-25 DIAGNOSIS — Z0000 Encounter for general adult medical examination without abnormal findings: Secondary | ICD-10-CM

## 2015-06-25 DIAGNOSIS — E785 Hyperlipidemia, unspecified: Secondary | ICD-10-CM

## 2015-06-25 DIAGNOSIS — M5431 Sciatica, right side: Secondary | ICD-10-CM | POA: Diagnosis not present

## 2015-06-25 DIAGNOSIS — M9903 Segmental and somatic dysfunction of lumbar region: Secondary | ICD-10-CM | POA: Diagnosis not present

## 2015-06-25 DIAGNOSIS — M5137 Other intervertebral disc degeneration, lumbosacral region: Secondary | ICD-10-CM | POA: Diagnosis not present

## 2015-06-25 DIAGNOSIS — M9904 Segmental and somatic dysfunction of sacral region: Secondary | ICD-10-CM | POA: Diagnosis not present

## 2015-06-25 MED ORDER — ZOLPIDEM TARTRATE 10 MG PO TABS
10.0000 mg | ORAL_TABLET | Freq: Every evening | ORAL | Status: DC | PRN
Start: 2015-06-25 — End: 2015-10-21

## 2015-06-25 NOTE — Progress Notes (Signed)
   Subjective:    Patient ID: [name] Bowman, female    DOB: 06/20/1975, 40 y.o.   MRN: [id number]  HPI 40 yr old female for a well exam. Her main concern today is low back pain that radiates to the right leg. She has disc disease at L5-S1 as seen in an MRI from 2012. She takes Ibuprofen but she worries about possible GI side effects. She has seen Dr. Sheran Luzichard Ramos and he has prescribed PT, which gave her mixed results. She has also received therapy at [facility], again with mixed results. As we reviewed her labs we see her LDL has jumped to over 200. She attributes this to being on a low carbohydrate diet, and I think this has resulted in her eating more fatty foods.    Review of Systems  Constitutional: Negative.   HENT: Negative.   Eyes: Negative.   Respiratory: Negative.   Cardiovascular: Negative.   Gastrointestinal: Negative.   Genitourinary: Negative for dysuria, urgency, frequency, hematuria, flank pain, decreased urine volume, enuresis, difficulty urinating, pelvic pain and dyspareunia.  Musculoskeletal: Positive for back pain. Negative for myalgias, joint swelling, arthralgias, gait problem, neck pain and neck stiffness.  Skin: Negative.   Neurological: Negative.   Psychiatric/Behavioral: Negative.        Objective:   Physical Exam  Constitutional: She is oriented to person, place, and time. She appears well-developed and well-nourished. No distress.  HENT:  Head: Normocephalic and atraumatic.  Right Ear: External ear normal.  Left Ear: External ear normal.  Nose: Nose normal.  Mouth/Throat: Oropharynx is clear and moist. No oropharyngeal exudate.  Eyes: Conjunctivae and EOM are normal. Pupils are equal, round, and reactive to light. No scleral icterus.  Neck: Normal range of motion. Neck supple. No JVD present. No thyromegaly present.  Cardiovascular: Normal rate, regular rhythm, normal heart sounds and intact distal pulses.  Exam reveals no gallop and no  friction rub.   No murmur heard. Pulmonary/Chest: Effort normal and breath sounds normal. No respiratory distress. She has no wheezes. She has no rales. She exhibits no tenderness.  Abdominal: Soft. Bowel sounds are normal. She exhibits no distension and no mass. There is no tenderness. There is no rebound and no guarding.  Musculoskeletal: Normal range of motion. She exhibits no edema or tenderness.  Lymphadenopathy:    She has no cervical adenopathy.  Neurological: She is alert and oriented to person, place, and time. She has normal reflexes. No cranial nerve deficit. She exhibits normal muscle tone. Coordination normal.  Skin: Skin is warm and dry. No rash noted. No erythema.  Psychiatric: She has a normal mood and affect. Her behavior is normal. Judgment and thought content normal.          Assessment & Plan:  Well exam. We discussed diet and exercise. I will refer her to Nutrition to help with weight and to help reduce her lipid levels. Recheck lipids in 6 months. I suggested she see a chiropractor about her back pain. Try ES Tylenol for the pain.  Nelwyn SalisburyFRY,Orange Hilligoss A, MD

## 2015-06-25 NOTE — Progress Notes (Signed)
Pre visit review using our clinic review tool, if applicable. No additional management support is needed unless otherwise documented below in the visit note. 

## 2015-07-19 DIAGNOSIS — M5441 Lumbago with sciatica, right side: Secondary | ICD-10-CM | POA: Diagnosis not present

## 2015-07-19 DIAGNOSIS — M5137 Other intervertebral disc degeneration, lumbosacral region: Secondary | ICD-10-CM | POA: Diagnosis not present

## 2015-07-19 DIAGNOSIS — E785 Hyperlipidemia, unspecified: Secondary | ICD-10-CM | POA: Diagnosis not present

## 2015-07-19 DIAGNOSIS — M5440 Lumbago with sciatica, unspecified side: Secondary | ICD-10-CM | POA: Diagnosis not present

## 2015-07-19 DIAGNOSIS — M5136 Other intervertebral disc degeneration, lumbar region: Secondary | ICD-10-CM | POA: Diagnosis not present

## 2015-07-22 DIAGNOSIS — M5416 Radiculopathy, lumbar region: Secondary | ICD-10-CM | POA: Diagnosis not present

## 2015-07-23 DIAGNOSIS — M545 Low back pain: Secondary | ICD-10-CM | POA: Diagnosis not present

## 2015-07-24 DIAGNOSIS — M5441 Lumbago with sciatica, right side: Secondary | ICD-10-CM | POA: Diagnosis not present

## 2015-07-24 DIAGNOSIS — M5126 Other intervertebral disc displacement, lumbar region: Secondary | ICD-10-CM | POA: Diagnosis not present

## 2015-07-24 DIAGNOSIS — M5416 Radiculopathy, lumbar region: Secondary | ICD-10-CM | POA: Diagnosis not present

## 2015-07-24 DIAGNOSIS — G8929 Other chronic pain: Secondary | ICD-10-CM | POA: Diagnosis not present

## 2015-07-25 ENCOUNTER — Ambulatory Visit: Payer: Self-pay | Admitting: Orthopedic Surgery

## 2015-07-28 ENCOUNTER — Ambulatory Visit: Payer: Self-pay | Admitting: Orthopedic Surgery

## 2015-07-28 NOTE — H&P (Signed)
Kyli G Barnfield is an 40 y.o. female.   Chief Complaint: R leg pain HPI: The patient is a 40 year old female who presents with back pain. The patient is here today for a surgical consult and in referral from Dr. Ramos. The patient reports low back symptoms which began 5 month(s) ago without any known injury. Symptoms are reported to be located in the low back and Symptoms include pain. The pain radiates to the right buttock, right posterior thigh, right posterior lower leg and right foot. Symptoms are exacerbated by standing. Current treatment includes opioid analgesics. Prior to being seen today the patient was previously evaluated by Dr. Ramos. Past evaluation has included x-ray of the lumbar spine and MRI of the lumbar spine. Past treatment has included opioid analgesics, muscle relaxants and epidural injections.  Lashawnna Uhls reports severe low right lower extremity radicular pain. She had a history of back pain, but her pain is mainly into the legs; it has been five months in duration. She is unable to straighten her leg out, walk, perform exercise due to the persistent pain. She has had and an injection, activity modification as well as strategies to avoid re-injury, prednisone, home exercise program, to no avail.   Past Medical History  Diagnosis Date  . HYPERLIPIDEMIA 03/29/2007  . ANEMIA-NOS 03/29/2007  . GERD 05/13/2008  . CONSTIPATION 05/16/2008  . Patellar tendinitis 05/31/2008  . INSOMNIA 03/29/2007  . Headache(784.0) 05/12/2007  . 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 McGkin in 2009 with ECHO (benign), then saw Dr. Darryl Kalil for exam (benign)  . History of exercise stress test 08-20-13    normal     Past Surgical History  Procedure Laterality Date  . Hammer toe surgery  2012    right 4th and 5th toes    Family History  Problem Relation Age of Onset  . Breast cancer Mother   . Breast cancer Maternal Grandmother   . Breast cancer Maternal Aunt   . Ovarian  cancer Maternal Aunt   . Diabetes Father   . Heart disease Father 53  . Colon cancer Neg Hx    Social History:  reports that she has never smoked. She has never used smokeless tobacco. She reports that she drinks about 0.6 oz of alcohol per week. She reports that she does not use illicit drugs.  Allergies:  Allergies  Allergen Reactions  . Sulfonamide Derivatives Rash     (Not in a hospital admission)  No results found for this or any previous visit (from the past 48 hour(s)). No results found.  Review of Systems  Constitutional: Negative.   HENT: Negative.   Eyes: Negative.   Respiratory: Negative.   Cardiovascular: Negative.   Gastrointestinal: Negative.   Genitourinary: Negative.   Musculoskeletal: Positive for back pain.  Skin: Negative.   Neurological: Positive for sensory change and focal weakness.  Psychiatric/Behavioral: Negative.     There were no vitals taken for this visit. Physical Exam  Constitutional: She is oriented to person, place, and time. She appears well-developed and well-nourished. She appears distressed.  HENT:  Head: Normocephalic.  Eyes: Pupils are equal, round, and reactive to light.  Neck: Normal range of motion.  Cardiovascular: Normal rate.   Respiratory: Effort normal.  GI: Soft.  Musculoskeletal:  On exam, healthy, moderate distress. Mood and affect are appropriate.  Walks with an antalgic gait. Sits with the right leg flexed.  Tender in the right proximal gluteus. Attempted straight leg raise   produces severe buttock, thigh and calf pain on the right, negative on the left. Diminished Achilles reflexes noted, slightly weak in plantar flexion. Sensory exam perhaps slightly altered in the S1 dermatome. Pelvis stable. No flank pain with percussion. No DVT. No instability of the hips, knees and ankles. Pulses are intact.  Neurological: She is alert and oriented to person, place, and time.  Skin: Skin is warm and dry.    Three views  demonstrate disc degeneration at L5-S1. No instability in flexion or extension. There is moderate degeneration of L4-5. Hips are unremarkable.  MRI July 12 demonstrates a large disc herniation, central and to the right, displacing the S1 nerve root. Mild bulging disc with associated degeneration at L4-5 and no neural compression.  Assessment/Plan 1. 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 Positive neural tension signs. Refractory to rest, activity modification, injection, dosepak and home exercise program. 2. Minor mechanical back pain secondary to lumbar spondylosis.   1. Extensive discussion concerning current pathology, relevant anatomy and treatment options. At this point in time, failing conservative treatment and severe negative affect of her activities of daily living and the presence of neurologic deficit, by mutual agreement, we will proceed with lumbar decompression at L5-S1. I had an extensive discussion of the risks and benefits of the lumbar decompression with the patient including bleeding, infection, damage to neurovascular structures, epidural fibrosis, CSF leak requiring repair. We also discussed increase in pain, adjacent segment disease, recurrent disc herniation, need for future surgery including repeat decompression and/or fusion. We also discussed risks of postoperative hematoma, paralysis, anesthetic complications including DVT, PE, death, cardiopulmonary dysfunction. In addition, the perioperative and postoperative courses were discussed in detail including the rehabilitative time and return to functional activity and work. I provided the patient with an illustrated handout and utilized the appropriate surgical models. 2. Continue avoiding stretching, flexion and avoid neural tension. She is to continue with her current pain medicine per Dr. Ethelene Halamos; she is taking Norco 10/325.  She can have refills by my request perioperatively. We reviewed her MRI and x-rays in extensive detail. We will proceed accordingly. She is otherwise healthy. I gave her a note to be out of work at this point and I would anticipate 12 weeks till maximum medical improvement, 6 weeks to recovery followed by 6 weeks of reconditioning.  Plan microlumbar decompression L5-S1 right  Dorothy SparkBISSELL, Ashling Roane M., PA-C for Dr. Shelle IronBeane 07/28/2015, 3:23 PM

## 2015-07-29 ENCOUNTER — Ambulatory Visit (HOSPITAL_COMMUNITY)
Admission: RE | Admit: 2015-07-29 | Discharge: 2015-07-29 | Disposition: A | Discharge status: Home / Self Care | Payer: BLUE CROSS/BLUE SHIELD | Source: Ambulatory Visit | Attending: Orthopedic Surgery | Admitting: Orthopedic Surgery

## 2015-07-29 ENCOUNTER — Encounter (HOSPITAL_COMMUNITY)
Admission: RE | Admit: 2015-07-29 | Discharge: 2015-07-29 | Disposition: A | Discharge status: Home / Self Care | Payer: BLUE CROSS/BLUE SHIELD | Source: Ambulatory Visit | Attending: Specialist | Admitting: Specialist

## 2015-07-29 ENCOUNTER — Encounter (HOSPITAL_COMMUNITY): Payer: Self-pay

## 2015-07-29 DIAGNOSIS — M5126 Other intervertebral disc displacement, lumbar region: Secondary | ICD-10-CM | POA: Insufficient documentation

## 2015-07-29 DIAGNOSIS — K6389 Other specified diseases of intestine: Secondary | ICD-10-CM | POA: Insufficient documentation

## 2015-07-29 DIAGNOSIS — M5136 Other intervertebral disc degeneration, lumbar region: Secondary | ICD-10-CM | POA: Diagnosis not present

## 2015-07-29 DIAGNOSIS — R195 Other fecal abnormalities: Secondary | ICD-10-CM | POA: Diagnosis not present

## 2015-07-29 LAB — BASIC METABOLIC PANEL
ANION GAP: 8 (ref 5–15)
BUN: 13 mg/dL (ref 6–20)
CALCIUM: 9.2 mg/dL (ref 8.9–10.3)
CO2: 28 mmol/L (ref 22–32)
Chloride: 101 mmol/L (ref 101–111)
Creatinine, Ser: 0.74 mg/dL (ref 0.44–1.00)
GFR calc Af Amer: >60 mL/min (ref >60)
GLUCOSE: 92 mg/dL (ref 65–99)
POTASSIUM: 4.4 mmol/L (ref 3.5–5.1)
Sodium: 137 mmol/L (ref 135–145)

## 2015-07-29 LAB — ABO/RH: ABO/RH(D): A POS

## 2015-07-29 LAB — HCG, SERUM, QUALITATIVE: Preg, Serum: NEGATIVE

## 2015-07-29 LAB — CBC
HEMATOCRIT: 41.6 % (ref 36.0–46.0)
HEMOGLOBIN: 14 g/dL (ref 12.0–15.0)
MCH: 31.7 pg (ref 26.0–34.0)
MCHC: 33.7 g/dL (ref 30.0–36.0)
MCV: 94.1 fL (ref 78.0–100.0)
PLATELETS: 323 10*3/uL (ref 150–400)
RBC: 4.42 MIL/uL (ref 3.87–5.11)
RDW: 12.5 % (ref 11.5–15.5)
WBC: 7.2 10*3/uL (ref 4.0–10.5)

## 2015-07-29 LAB — SURGICAL PCR SCREEN
MRSA, PCR: NEGATIVE
Staphylococcus aureus: NEGATIVE

## 2015-07-29 NOTE — Progress Notes (Signed)
Routed via [facility] lumbar spine xray of 07/29/15 and also called and spoke with Irish EldersJackie Bissell [facility].  Dr Shelle IronBeane is in office today and she stated she would let him be aware of results.

## 2015-07-29 NOTE — Patient Instructions (Signed)
[name] Bowman  07/29/2015   Your procedure is scheduled on: 08/01/2015    [facility]  [facility]  elevators to 3rd floor to  Short Stay Center at   1100 AM.  Call this number if you have problems the morning of surgery [phone]   Remember: ONLY 1 PERSON MAY GO WITH YOU TO SHORT STAY TO GET  READY MORNING OF YOUR SURGERY.  Do not eat food or drink liquids :After Midnight.     Take these medicines the morning of surgery with A SIP OF WATER: Hydrocodone if needed                                 You may not have any metal on your body including hair pins and              piercings  Do not wear jewelry, make-up, lotions, powders or perfumes, deodorant             Do not wear nail polish.  Do not shave  48 hours prior to surgery.                 Do not bring valuables to the hospital. [place] IS NOT             RESPONSIBLE   FOR VALUABLES.  Contacts, dentures or bridgework may not be worn into surgery.  Leave suitcase in the car. After surgery it may be brought to your room.         Special Instructions: coughing and deep breathing exercises, leg exercises               Please read over the following fact sheets you were given: _____________________________________________________________________             [facility] - Preparing for Surgery Before surgery, you can play an important role.  Because skin is not sterile, your skin needs to be as free of germs as possible.  You can reduce the number of germs on your skin by washing with CHG (chlorahexidine gluconate) soap before surgery.  CHG is an antiseptic cleaner which kills germs and bonds with the skin to continue killing germs even after washing. Please DO NOT use if you have an allergy to CHG or antibacterial soaps.  If your skin becomes reddened/irritated stop using the CHG and inform your nurse when you arrive at Short Stay. Do not shave (including legs and  underarms) for at least 48 hours prior to the first CHG shower.  You may shave your face/neck. Please follow these instructions carefully:  1.  Shower with CHG Soap the night before surgery and the  morning of Surgery.  2.  If you choose to wash your hair, wash your hair first as usual with your  normal  shampoo.  3.  After you shampoo, rinse your hair and body thoroughly to remove the  shampoo.                           4.  Use CHG as you would any other liquid soap.  You can apply chg directly  to the skin and wash  Gently with a scrungie or clean washcloth.  5.  Apply the CHG Soap to your body ONLY FROM THE NECK DOWN.   Do not use on face/ open                           Wound or open sores. Avoid contact with eyes, ears mouth and genitals (private parts).                       Wash face,  Genitals (private parts) with your normal soap.             6.  Wash thoroughly, paying special attention to the area where your surgery  will be performed.  7.  Thoroughly rinse your body with warm water from the neck down.  8.  DO NOT shower/wash with your normal soap after using and rinsing off  the CHG Soap.                9.  Pat yourself dry with a clean towel.            10.  Wear clean pajamas.            11.  Place clean sheets on your bed the night of your first shower and do not  sleep with pets. Day of Surgery : Do not apply any lotions/deodorants the morning of surgery.  Please wear clean clothes to the hospital/surgery center.  FAILURE TO FOLLOW THESE INSTRUCTIONS MAY RESULT IN THE CANCELLATION OF YOUR SURGERY PATIENT SIGNATURE_________________________________  NURSE SIGNATURE__________________________________  ________________________________________________________________________   [name] Bowman  An incentive spirometer is a tool that can help keep your lungs clear and active. This tool measures how well you are filling your lungs with each breath. Taking  long deep breaths may help reverse or decrease the chance of developing breathing (pulmonary) problems (especially infection) following:  A long period of time when you are unable to move or be active. BEFORE THE PROCEDURE   If the spirometer includes an indicator to show your best effort, your nurse or respiratory therapist will set it to a desired goal.  If possible, sit up straight or lean slightly forward. Try not to slouch.  Hold the incentive spirometer in an upright position. INSTRUCTIONS FOR USE  1. Sit on the edge of your bed if possible, or sit up as far as you can in bed or on a chair. 2. Hold the incentive spirometer in an upright position. 3. Breathe out normally. 4. Place the mouthpiece in your mouth and seal your lips tightly around it. 5. Breathe in slowly and as deeply as possible, raising the piston or the ball toward the top of the column. 6. Hold your breath for 3-5 seconds or for as long as possible. Allow the piston or ball to fall to the bottom of the column. 7. Remove the mouthpiece from your mouth and breathe out normally. 8. Rest for a few seconds and repeat Steps 1 through 7 at least 10 times every 1-2 hours when you are awake. Take your time and take a few normal breaths between deep breaths. 9. The spirometer may include an indicator to show your best effort. Use the indicator as a goal to work toward during each repetition. 10. After each set of 10 deep breaths, practice coughing to be sure your lungs are clear. If you have an incision (the cut made at the time of surgery),  support your incision when coughing by placing a pillow or rolled up towels firmly against it. Once you are able to get out of bed, walk around indoors and cough well. You may stop using the incentive spirometer when instructed by your caregiver.  RISKS AND COMPLICATIONS  Take your time so you do not get dizzy or light-headed.  If you are in pain, you may need to take or ask for pain  medication before doing incentive spirometry. It is harder to take a deep breath if you are having pain. AFTER USE  Rest and breathe slowly and easily.  It can be helpful to keep track of a log of your progress. Your caregiver can provide you with a simple table to help with this. If you are using the spirometer at home, follow these instructions: [place] IF:   You are having difficultly using the spirometer.  You have trouble using the spirometer as often as instructed.  Your pain medication is not giving enough relief while using the spirometer.  You develop fever of 100.5 F (38.1 C) or higher. SEEK IMMEDIATE MEDICAL CARE IF:   You cough up bloody sputum that had not been present before.  You develop fever of 102 F (38.9 C) or greater.  You develop worsening pain at or near the incision site. MAKE SURE YOU:   Understand these instructions.  Will watch your condition.  Will get help right away if you are not doing well or get worse. Document Released: 05/10/2006 Document Revised: 03/22/2011 Document Reviewed: 07/11/2006 [facility] Patient Information 2014 [place], [place].   ________________________________________________________________________

## 2015-08-01 ENCOUNTER — Ambulatory Visit (HOSPITAL_COMMUNITY): Payer: BLUE CROSS/BLUE SHIELD

## 2015-08-01 ENCOUNTER — Ambulatory Visit (HOSPITAL_COMMUNITY): Payer: BLUE CROSS/BLUE SHIELD | Admitting: Anesthesiology

## 2015-08-01 ENCOUNTER — Encounter (HOSPITAL_COMMUNITY): Payer: Self-pay

## 2015-08-01 ENCOUNTER — Ambulatory Visit (HOSPITAL_COMMUNITY)
Admission: RE | Admit: 2015-08-01 | Discharge: 2015-08-02 | Disposition: A | Discharge status: Home / Self Care | Payer: BLUE CROSS/BLUE SHIELD | Source: Ambulatory Visit | Attending: Specialist | Admitting: Specialist

## 2015-08-01 ENCOUNTER — Encounter (HOSPITAL_COMMUNITY)
Admission: RE | Disposition: A | Discharge status: Home / Self Care | Payer: Self-pay | Source: Ambulatory Visit | Attending: Specialist

## 2015-08-01 DIAGNOSIS — M4806 Spinal stenosis, lumbar region: Secondary | ICD-10-CM | POA: Insufficient documentation

## 2015-08-01 DIAGNOSIS — M4807 Spinal stenosis, lumbosacral region: Secondary | ICD-10-CM | POA: Diagnosis not present

## 2015-08-01 DIAGNOSIS — M5116 Intervertebral disc disorders with radiculopathy, lumbar region: Secondary | ICD-10-CM | POA: Insufficient documentation

## 2015-08-01 DIAGNOSIS — M48061 Spinal stenosis, lumbar region without neurogenic claudication: Secondary | ICD-10-CM | POA: Diagnosis present

## 2015-08-01 DIAGNOSIS — M5136 Other intervertebral disc degeneration, lumbar region: Secondary | ICD-10-CM | POA: Diagnosis not present

## 2015-08-01 DIAGNOSIS — Z419 Encounter for procedure for purposes other than remedying health state, unspecified: Secondary | ICD-10-CM

## 2015-08-01 DIAGNOSIS — M5127 Other intervertebral disc displacement, lumbosacral region: Secondary | ICD-10-CM | POA: Diagnosis not present

## 2015-08-01 DIAGNOSIS — M5126 Other intervertebral disc displacement, lumbar region: Secondary | ICD-10-CM

## 2015-08-01 HISTORY — PX: LUMBAR LAMINECTOMY/DECOMPRESSION MICRODISCECTOMY: SHX5026

## 2015-08-01 LAB — TYPE AND SCREEN
ABO/RH(D): A POS
Antibody Screen: NEGATIVE

## 2015-08-01 SURGERY — LUMBAR LAMINECTOMY/DECOMPRESSION MICRODISCECTOMY 1 LEVEL
Anesthesia: General | Site: Back | Laterality: Right

## 2015-08-01 MED ORDER — ONDANSETRON HCL 4 MG/2ML IJ SOLN
INTRAMUSCULAR | Status: DC | PRN
  Administered 2015-08-01: 4 mg via INTRAVENOUS

## 2015-08-01 MED ORDER — FENTANYL CITRATE (PF) 250 MCG/5ML IJ SOLN
INTRAMUSCULAR | Status: AC
  Filled 2015-08-01: qty 5

## 2015-08-01 MED ORDER — MAGNESIUM CITRATE PO SOLN: 1.0000 | Freq: Once | ORAL | Status: DC | PRN

## 2015-08-01 MED ORDER — METHOCARBAMOL 1000 MG/10ML IJ SOLN
500.0000 mg | Freq: Four times a day (QID) | INTRAVENOUS | Status: DC | PRN
  Administered 2015-08-01: 500 mg via INTRAVENOUS
  Filled 2015-08-01: qty 550
  Filled 2015-08-01: qty 5

## 2015-08-01 MED ORDER — HYDROMORPHONE HCL 1 MG/ML IJ SOLN: 0.5000 mg | INTRAMUSCULAR | Status: DC | PRN

## 2015-08-01 MED ORDER — LIDOCAINE HCL (CARDIAC) 20 MG/ML IV SOLN
INTRAVENOUS | Status: DC | PRN
  Administered 2015-08-01: 50 mg via INTRATRACHEAL

## 2015-08-01 MED ORDER — KCL IN DEXTROSE-NACL 20-5-0.45 MEQ/L-%-% IV SOLN
INTRAVENOUS | Status: DC
  Administered 2015-08-01: 17:00:00 via INTRAVENOUS
  Filled 2015-08-01 (×2): qty 1000

## 2015-08-01 MED ORDER — METHOCARBAMOL 500 MG PO TABS: 500.0000 mg | ORAL_TABLET | Freq: Four times a day (QID) | ORAL | Status: DC | PRN

## 2015-08-01 MED ORDER — ACETAMINOPHEN 10 MG/ML IV SOLN
INTRAVENOUS | Status: AC
  Filled 2015-08-01: qty 100

## 2015-08-01 MED ORDER — SODIUM CHLORIDE 0.9 % IR SOLN
Status: AC
  Filled 2015-08-01: qty 1

## 2015-08-01 MED ORDER — POLYETHYLENE GLYCOL 3350 17 G PO PACK: 17.0000 g | PACK | Freq: Every day | ORAL | Status: DC

## 2015-08-01 MED ORDER — MIDAZOLAM HCL 5 MG/5ML IJ SOLN
INTRAMUSCULAR | Status: DC | PRN
  Administered 2015-08-01: 2 mg via INTRAVENOUS

## 2015-08-01 MED ORDER — CEFAZOLIN SODIUM-DEXTROSE 2-4 GM/100ML-% IV SOLN
INTRAVENOUS | Status: AC
  Filled 2015-08-01: qty 100

## 2015-08-01 MED ORDER — HYDROCODONE-ACETAMINOPHEN 5-325 MG PO TABS
1.0000 | ORAL_TABLET | ORAL | Status: DC | PRN
  Administered 2015-08-01 (×2): 2 via ORAL
  Filled 2015-08-01 (×2): qty 2

## 2015-08-01 MED ORDER — SUGAMMADEX SODIUM 200 MG/2ML IV SOLN
INTRAVENOUS | Status: AC
  Filled 2015-08-01: qty 2

## 2015-08-01 MED ORDER — CEFAZOLIN SODIUM-DEXTROSE 2-4 GM/100ML-% IV SOLN
2.0000 g | Freq: Three times a day (TID) | INTRAVENOUS | Status: AC
  Administered 2015-08-01 – 2015-08-02 (×2): 2 g via INTRAVENOUS
  Filled 2015-08-01 (×2): qty 100

## 2015-08-01 MED ORDER — POLYETHYLENE GLYCOL 3350 17 G PO PACK
17.0000 g | PACK | Freq: Every day | ORAL | Status: DC
  Administered 2015-08-01 – 2015-08-02 (×2): 17 g via ORAL
  Filled 2015-08-01 (×2): qty 1

## 2015-08-01 MED ORDER — CEFAZOLIN SODIUM-DEXTROSE 2-4 GM/100ML-% IV SOLN
2.0000 g | INTRAVENOUS | Status: AC
  Administered 2015-08-01: 2 g via INTRAVENOUS
  Filled 2015-08-01: qty 100

## 2015-08-01 MED ORDER — ONDANSETRON HCL 4 MG/2ML IJ SOLN: 4.0000 mg | INTRAMUSCULAR | Status: DC | PRN

## 2015-08-01 MED ORDER — ONDANSETRON HCL 4 MG/2ML IJ SOLN
INTRAMUSCULAR | Status: AC
  Filled 2015-08-01: qty 2

## 2015-08-01 MED ORDER — LACTATED RINGERS IV SOLN
INTRAVENOUS | Status: DC
  Administered 2015-08-01: 12:00:00 via INTRAVENOUS

## 2015-08-01 MED ORDER — ZOLPIDEM TARTRATE 5 MG PO TABS: 5.0000 mg | ORAL_TABLET | Freq: Every evening | ORAL | Status: DC | PRN

## 2015-08-01 MED ORDER — DOCUSATE SODIUM 100 MG PO CAPS
100.0000 mg | ORAL_CAPSULE | Freq: Two times a day (BID) | ORAL | Status: DC
  Administered 2015-08-01 – 2015-08-02 (×2): 100 mg via ORAL
  Filled 2015-08-01 (×2): qty 1

## 2015-08-01 MED ORDER — PHENYLEPHRINE HCL 10 MG/ML IJ SOLN
INTRAMUSCULAR | Status: DC | PRN
  Administered 2015-08-01: 80 ug via INTRAVENOUS

## 2015-08-01 MED ORDER — DEXAMETHASONE SODIUM PHOSPHATE 10 MG/ML IJ SOLN
INTRAMUSCULAR | Status: DC | PRN
  Administered 2015-08-01: 10 mg via INTRAVENOUS

## 2015-08-01 MED ORDER — OXYCODONE-ACETAMINOPHEN 5-325 MG PO TABS: 1.0000 | ORAL_TABLET | ORAL | Status: DC | PRN

## 2015-08-01 MED ORDER — MIDAZOLAM HCL 2 MG/2ML IJ SOLN
INTRAMUSCULAR | Status: AC
  Filled 2015-08-01: qty 2

## 2015-08-01 MED ORDER — BUPIVACAINE-EPINEPHRINE 0.5% -1:200000 IJ SOLN
INTRAMUSCULAR | Status: DC | PRN
  Administered 2015-08-01: 13 mL
  Administered 2015-08-01: 20 mL

## 2015-08-01 MED ORDER — RISAQUAD PO CAPS
1.0000 | ORAL_CAPSULE | Freq: Every day | ORAL | Status: DC
  Administered 2015-08-01 – 2015-08-02 (×2): 1 via ORAL
  Filled 2015-08-01 (×2): qty 1

## 2015-08-01 MED ORDER — ACETAMINOPHEN 650 MG RE SUPP: 650.0000 mg | RECTAL | Status: DC | PRN

## 2015-08-01 MED ORDER — PROMETHAZINE HCL 25 MG/ML IJ SOLN: 6.2500 mg | INTRAMUSCULAR | Status: DC | PRN

## 2015-08-01 MED ORDER — SODIUM CHLORIDE 0.9 % IR SOLN
Status: DC | PRN
  Administered 2015-08-01: 500 mL

## 2015-08-01 MED ORDER — HYDROMORPHONE HCL 1 MG/ML IJ SOLN: 0.2500 mg | INTRAMUSCULAR | Status: DC | PRN

## 2015-08-01 MED ORDER — DOCUSATE SODIUM 100 MG PO CAPS: 100.0000 mg | ORAL_CAPSULE | Freq: Two times a day (BID) | ORAL | Status: DC | PRN

## 2015-08-01 MED ORDER — SUGAMMADEX SODIUM 200 MG/2ML IV SOLN
INTRAVENOUS | Status: DC | PRN
  Administered 2015-08-01: 200 mg via INTRAVENOUS

## 2015-08-01 MED ORDER — THROMBIN 5000 UNITS EX SOLR
CUTANEOUS | Status: AC
  Filled 2015-08-01: qty 5000

## 2015-08-01 MED ORDER — BISACODYL 5 MG PO TBEC: 5.0000 mg | DELAYED_RELEASE_TABLET | Freq: Every day | ORAL | Status: DC | PRN

## 2015-08-01 MED ORDER — ACETAMINOPHEN 10 MG/ML IV SOLN
1000.0000 mg | Freq: Once | INTRAVENOUS | Status: AC
  Administered 2015-08-01: 1000 mg via INTRAVENOUS

## 2015-08-01 MED ORDER — ACETAMINOPHEN 325 MG PO TABS: 650.0000 mg | ORAL_TABLET | ORAL | Status: DC | PRN

## 2015-08-01 MED ORDER — PROPOFOL 10 MG/ML IV BOLUS
INTRAVENOUS | Status: AC
  Filled 2015-08-01: qty 20

## 2015-08-01 MED ORDER — PHENYLEPHRINE 40 MCG/ML (10ML) SYRINGE FOR IV PUSH (FOR BLOOD PRESSURE SUPPORT)
PREFILLED_SYRINGE | INTRAVENOUS | Status: AC
  Filled 2015-08-01: qty 10

## 2015-08-01 MED ORDER — PHENOL 1.4 % MT LIQD
1.0000 | OROMUCOSAL | Status: DC | PRN
  Filled 2015-08-01: qty 177

## 2015-08-01 MED ORDER — POLYETHYLENE GLYCOL 3350 17 G PO PACK: 17.0000 g | PACK | Freq: Every day | ORAL | Status: DC | PRN

## 2015-08-01 MED ORDER — PROPOFOL 10 MG/ML IV BOLUS
INTRAVENOUS | Status: DC | PRN
  Administered 2015-08-01: 120 mg via INTRAVENOUS

## 2015-08-01 MED ORDER — ALUM & MAG HYDROXIDE-SIMETH 200-200-20 MG/5ML PO SUSP: 30.0000 mL | Freq: Four times a day (QID) | ORAL | Status: DC | PRN

## 2015-08-01 MED ORDER — BUPIVACAINE-EPINEPHRINE (PF) 0.5% -1:200000 IJ SOLN
INTRAMUSCULAR | Status: AC
  Filled 2015-08-01: qty 30

## 2015-08-01 MED ORDER — FENTANYL CITRATE (PF) 250 MCG/5ML IJ SOLN
INTRAMUSCULAR | Status: DC | PRN
  Administered 2015-08-01: 25 ug via INTRAVENOUS
  Administered 2015-08-01: 50 ug via INTRAVENOUS
  Administered 2015-08-01: 25 ug via INTRAVENOUS

## 2015-08-01 MED ORDER — LIDOCAINE HCL (CARDIAC) 20 MG/ML IV SOLN
INTRAVENOUS | Status: AC
  Filled 2015-08-01: qty 5

## 2015-08-01 MED ORDER — MENTHOL 3 MG MT LOZG: 1.0000 | LOZENGE | OROMUCOSAL | Status: DC | PRN

## 2015-08-01 MED ORDER — DEXAMETHASONE SODIUM PHOSPHATE 10 MG/ML IJ SOLN
INTRAMUSCULAR | Status: AC
  Filled 2015-08-01: qty 1

## 2015-08-01 MED ORDER — ROCURONIUM BROMIDE 100 MG/10ML IV SOLN
INTRAVENOUS | Status: DC | PRN
  Administered 2015-08-01: 40 mg via INTRAVENOUS

## 2015-08-01 SURGICAL SUPPLY — 47 items
BAG ZIPLOCK 12X15 (MISCELLANEOUS) IMPLANT
CLEANER TIP ELECTROSURG 2X2 (MISCELLANEOUS) ×2 IMPLANT
CLOTH 2% CHLOROHEXIDINE 3PK (PERSONAL CARE ITEMS) ×2 IMPLANT
DRAPE MICROSCOPE LEICA (MISCELLANEOUS) ×2 IMPLANT
DRAPE POUCH INSTRU U-SHP 10X18 (DRAPES) ×2 IMPLANT
DRAPE SHEET LG 3/4 BI-LAMINATE (DRAPES) ×2 IMPLANT
DRAPE SURG 17X11 SM STRL (DRAPES) ×2 IMPLANT
DRAPE UTILITY XL STRL (DRAPES) ×2 IMPLANT
DRSG AQUACEL AG ADV 3.5X 4 (GAUZE/BANDAGES/DRESSINGS) ×2 IMPLANT
DRSG AQUACEL AG ADV 3.5X 6 (GAUZE/BANDAGES/DRESSINGS) IMPLANT
DURAPREP 26ML APPLICATOR (WOUND CARE) ×2 IMPLANT
DURASEAL SPINE SEALANT 3ML (MISCELLANEOUS) IMPLANT
ELECT BLADE TIP CTD 4 INCH (ELECTRODE) IMPLANT
ELECT REM PT RETURN 9FT ADLT (ELECTROSURGICAL) ×2
ELECTRODE REM PT RTRN 9FT ADLT (ELECTROSURGICAL) ×1 IMPLANT
GLOVE BIOGEL PI IND STRL 7.0 (GLOVE) ×1 IMPLANT
GLOVE BIOGEL PI INDICATOR 7.0 (GLOVE) ×1
GLOVE SURG SS PI 7.0 STRL IVOR (GLOVE) ×2 IMPLANT
GLOVE SURG SS PI 7.5 STRL IVOR (GLOVE) ×2 IMPLANT
GLOVE SURG SS PI 8.0 STRL IVOR (GLOVE) ×4 IMPLANT
GOWN STRL REUS W/TWL XL LVL3 (GOWN DISPOSABLE) ×4 IMPLANT
HEMOSTAT SPONGE AVITENE ULTRA (HEMOSTASIS) ×2 IMPLANT
IV CATH 14GX2 1/4 (CATHETERS) ×2 IMPLANT
KIT BASIN OR (CUSTOM PROCEDURE TRAY) ×2 IMPLANT
KIT POSITIONING SURG ANDREWS (MISCELLANEOUS) ×2 IMPLANT
MANIFOLD NEPTUNE II (INSTRUMENTS) ×2 IMPLANT
NEEDLE SPNL 18GX3.5 QUINCKE PK (NEEDLE) ×4 IMPLANT
PACK LAMINECTOMY ORTHO (CUSTOM PROCEDURE TRAY) ×2 IMPLANT
PATTIES SURGICAL .5 X.5 (GAUZE/BANDAGES/DRESSINGS) IMPLANT
PATTIES SURGICAL .75X.75 (GAUZE/BANDAGES/DRESSINGS) IMPLANT
PATTIES SURGICAL 1X1 (DISPOSABLE) IMPLANT
RUBBERBAND STERILE (MISCELLANEOUS) ×2 IMPLANT
SPONGE SURGIFOAM ABS GEL 100 (HEMOSTASIS) ×2 IMPLANT
STAPLER VISISTAT (STAPLE) IMPLANT
STRIP CLOSURE SKIN 1/2X4 (GAUZE/BANDAGES/DRESSINGS) ×2 IMPLANT
SUT NURALON 4 0 TR CR/8 (SUTURE) IMPLANT
SUT PROLENE 3 0 PS 2 (SUTURE) ×2 IMPLANT
SUT VIC AB 1 CT1 27 (SUTURE)
SUT VIC AB 1 CT1 27XBRD ANTBC (SUTURE) IMPLANT
SUT VIC AB 1-0 CT2 27 (SUTURE) ×2 IMPLANT
SUT VIC AB 2-0 CT1 27 (SUTURE) ×1
SUT VIC AB 2-0 CT1 TAPERPNT 27 (SUTURE) ×1 IMPLANT
SUT VIC AB 2-0 CT2 27 (SUTURE) ×2 IMPLANT
SYR 3ML LL SCALE MARK (SYRINGE) ×2 IMPLANT
TOWEL OR 17X26 10 PK STRL BLUE (TOWEL DISPOSABLE) ×2 IMPLANT
TOWEL OR NON WOVEN STRL DISP B (DISPOSABLE) IMPLANT
YANKAUER SUCT BULB TIP NO VENT (SUCTIONS) IMPLANT

## 2015-08-01 NOTE — Op Note (Signed)
[name], [name] NO.:  [id number]  MEDICAL RECORD NO.:  [id number]  LOCATION:  1614                         FACILITY:  [facility]  PHYSICIAN:  [name] Bowman, M.D.    DATE OF BIRTH:  November 16, 1975  DATE OF PROCEDURE:  08/01/2015 DATE OF DISCHARGE:                              OPERATIVE REPORT   PREOPERATIVE DIAGNOSES:  Spinal stenosis; herniated nucleus pulposus, L5- S1, right.  POSTOPERATIVE DIAGNOSES:  Spinal stenosis; herniated nucleus pulposus, L5-S1, right.  PROCEDURES PERFORMED: 1. Microlumbar decompression of L5-S1, right. 2. Foraminotomies of L5-S1, right. 3. Microdiskectomy of L5-S1, right.  ANESTHESIA:  General.  ASSISTANT:  [name] Poche, PA.  HISTORY:  A 40, S1 and L5 radiculopathy secondary to large HNP.  Neural tension signs, EHL weakness, refractory to conservative treatment, presence of neurologic deficit, HNP on an MRI, she was indicated for microlumbar decompression.  Risk and benefits were discussed including bleeding, infection, damage to the neurovascular structures, no change in symptoms, worsening symptoms, DVT, PE, anesthetic complications, etc.  TECHNIQUE:  With the patient in supine position, after induction of adequate general anesthesia and 2 g of Kefzol, placed prone on the [place] frame.  All bony prominences were well padded.  Lumbar region was prepped and draped in usual sterile fashion.  Two 18-gauge spinal needles were utilized to localize the 5-1 interspace and confirmed with x-ray.  Incision was made in the spinous process of 5-1.  Subcutaneous tissue was dissected.  Electrocautery was utilized to achieve hemostasis.  Dorsolumbar fascia was identified, divided in line with skin incision.  Paraspinous muscle was elevated from lamina of 5 and 1. McCullough retractor was placed and operating microscope was draped and brought on the surgical field.  Confirmatory radiograph obtained with Penfield at the bottom of the  interlaminar space at 5-1.  A straight curette was utilized to detach the ligamentum flavum from the cephalad edge of S1.  Placed a neural patty beneath the ligamentum flavum.  I then detached the ligamentum flavum fully.  I performed a foraminotomy of S1 protecting the nerve root.  Hemilaminotomy of the caudad edge of 5 was performed as well as to detach the ligamentum flavum.  S1 nerve root was noted to be compressed in the lateral recess with an axillary disk herniation.  It was erythematous and edematous.  In the axilla of the nerve root, we gently mobilized the disk herniation with a Penfield and it was extruded, then was retrieved with a micropituitary and a nerve hook.  Two large fragments were removed from beneath thecal sac and the nerve root very gently.  I was able to then gently mobilize the S1 nerve root medially and decompressed the lateral recess to the medial border of the pedicle, performing a foraminotomy of 5.  There was a hardened disk space, but no lateral disk herniation.  There was neural probe passed freely to above the pedicle of 5 and out the foramen of 5.  We then checked again in the axilla and the shoulder of the root, one additional fragment was noted.  We irrigated the disk space with antibiotic irrigation.  No further herniation was noted or retrieved. There was 1 cm of excursion of the S1 nerve root  medial to pedicle without tension.  No probe passed freely at the foramen of L5 and S1. Inspection revealed no CSF leakage or active bleeding.  Copiously irrigated the wound.  We removed the McCullough retractor and irrigated the paraspinous musculature, no active bleeding.  We closed the dorsolumbar fascia with 1 Vicryl, subcu with 2-0 and skin with Prolene. Sterile dressing applied.  Placed supine on the hospital bed, extubated without difficulty, and transported to the recovery room in satisfactory condition.  The patient tolerated the procedure well.  No  complications.  Assistant, [name] PocheJacqueline Bowman, [place], was used throughout the case for patient positioning, gentle intermittent neural traction and closure.  No blood loss.     [name] EveryJeffrey Yetunde Bowman, M.D.     [name] PenJB/MEDQ  D:  08/01/2015  T:  08/01/2015  Job:  [id number]

## 2015-08-01 NOTE — H&P (View-Only) (Signed)
[name] Bowman is an 40 y.o. female.   Chief Complaint: R leg pain HPI: The patient is a 40 year old female who presents with back pain. The patient is here today for a surgical consult and in referral from Dr. Ethelene Hal. The patient reports low back symptoms which began 5 month(s) ago without any known injury. Symptoms are reported to be located in the low back and Symptoms include pain. The pain radiates to the right buttock, right posterior thigh, right posterior lower leg and right foot. Symptoms are exacerbated by standing. Current treatment includes opioid analgesics. Prior to being seen today the patient was previously evaluated by Dr. Ethelene Hal. Past evaluation has included x-ray of the lumbar spine and MRI of the lumbar spine. Past treatment has included opioid analgesics, muscle relaxants and epidural injections.  Jewels Langone reports severe low right lower extremity radicular pain. She had a history of back pain, but her pain is mainly into the legs; it has been five months in duration. She is unable to straighten her leg out, walk, perform exercise due to the persistent pain. She has had and an injection, activity modification as well as strategies to avoid re-injury, prednisone, home exercise program, to no avail.   Past Medical History  Diagnosis Date  . HYPERLIPIDEMIA 03/29/2007  . ANEMIA-NOS 03/29/2007  . GERD 05/13/2008  . CONSTIPATION 05/16/2008  . Patellar tendinitis 05/31/2008  . INSOMNIA 03/29/2007  . Headache(784.0) 05/12/2007  . 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 Kenna Gilbert in 2009 with ECHO (benign), then saw Dr. Merrily Pew for exam (benign)  . History of exercise stress test 08-20-13    normal     Past Surgical History  Procedure Laterality Date  . Hammer toe surgery  2012    right 4th and 5th toes    Family History  Problem Relation Age of Onset  . Breast cancer Mother   . Breast cancer Maternal Grandmother   . Breast cancer Maternal Aunt   . Ovarian  cancer Maternal Aunt   . Diabetes Father   . Heart disease Father 38  . Colon cancer Neg Hx    Social History:  reports that she has never smoked. She has never used smokeless tobacco. She reports that she drinks about 0.6 oz of alcohol per week. She reports that she does not use illicit drugs.  Allergies:  Allergies  Allergen Reactions  . Sulfonamide Derivatives Rash     (Not in a hospital admission)  No results found for this or any previous visit (from the past 48 hour(s)). No results found.  Review of Systems  Constitutional: Negative.   HENT: Negative.   Eyes: Negative.   Respiratory: Negative.   Cardiovascular: Negative.   Gastrointestinal: Negative.   Genitourinary: Negative.   Musculoskeletal: Positive for back pain.  Skin: Negative.   Neurological: Positive for sensory change and focal weakness.  Psychiatric/Behavioral: Negative.     There were no vitals taken for this visit. Physical Exam  Constitutional: She is oriented to person, place, and time. She appears well-developed and well-nourished. She appears distressed.  HENT:  Head: Normocephalic.  Eyes: Pupils are equal, round, and reactive to light.  Neck: Normal range of motion.  Cardiovascular: Normal rate.   Respiratory: Effort normal.  GI: Soft.  Musculoskeletal:  On exam, healthy, moderate distress. Mood and affect are appropriate.  Walks with an antalgic gait. Sits with the right leg flexed.  Tender in the right proximal gluteus. Attempted straight leg raise  produces severe buttock, thigh and calf pain on the right, negative on the left. Diminished Achilles reflexes noted, slightly weak in plantar flexion. Sensory exam perhaps slightly altered in the S1 dermatome. Pelvis stable. No flank pain with percussion. No DVT. No instability of the hips, knees and ankles. Pulses are intact.  Neurological: She is alert and oriented to person, place, and time.  Skin: Skin is warm and dry.    Three views  demonstrate disc degeneration at L5-S1. No instability in flexion or extension. There is moderate degeneration of L4-5. Hips are unremarkable.  MRI July 12 demonstrates a large disc herniation, central and to the right, displacing the S1 nerve root. Mild bulging disc with associated degeneration at L4-5 and no neural compression.  Assessment/Plan 1. 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 Positive neural tension signs. Refractory to rest, activity modification, injection, dosepak and home exercise program. 2. Minor mechanical back pain secondary to lumbar spondylosis.   1. Extensive discussion concerning current pathology, relevant anatomy and treatment options. At this point in time, failing conservative treatment and severe negative affect of her activities of daily living and the presence of neurologic deficit, by mutual agreement, we will proceed with lumbar decompression at L5-S1. I had an extensive discussion of the risks and benefits of the lumbar decompression with the patient including bleeding, infection, damage to neurovascular structures, epidural fibrosis, CSF leak requiring repair. We also discussed increase in pain, adjacent segment disease, recurrent disc herniation, need for future surgery including repeat decompression and/or fusion. We also discussed risks of postoperative hematoma, paralysis, anesthetic complications including DVT, PE, death, cardiopulmonary dysfunction. In addition, the perioperative and postoperative courses were discussed in detail including the rehabilitative time and return to functional activity and work. I provided the patient with an illustrated handout and utilized the appropriate surgical models. 2. Continue avoiding stretching, flexion and avoid neural tension. She is to continue with her current pain medicine per Dr. Ethelene Halamos; she is taking Norco 10/325.  She can have refills by my request perioperatively. We reviewed her MRI and x-rays in extensive detail. We will proceed accordingly. She is otherwise healthy. I gave her a note to be out of work at this point and I would anticipate 12 weeks till maximum medical improvement, 6 weeks to recovery followed by 6 weeks of reconditioning.  Plan microlumbar decompression L5-S1 right  Dorothy SparkBISSELL, Courtenay Hirth M., PA-C for Dr. Shelle IronBeane 07/28/2015, 3:23 PM

## 2015-08-01 NOTE — Interval H&P Note (Signed)
History and Physical Interval Note:  08/01/2015 1:19 PM  [name] BullionKimberly G Arvanitis  has presented today for surgery, with the diagnosis of HNP L5-S1 RIGHT   The various methods of treatment have been discussed with the patient and family. After consideration of risks, benefits and other options for treatment, the patient has consented to  Procedure(s): MICRO LUMBAR DECOMPRESSION L5-S1 RIGHT (1 LEVEL) (Right) as a surgical intervention .  The patient's history has been reviewed, patient examined, no change in status, stable for surgery.  I have reviewed the patient's chart and labs.  Questions were answered to the patient's satisfaction.     Ebonique Hallstrom C

## 2015-08-01 NOTE — Transfer of Care (Signed)
Immediate Anesthesia Transfer of Care Note  Patient: [name] BullionKimberly G Marlar  Procedure(s) Performed: Procedure(s): MICRO LUMBAR DECOMPRESSION L5-S1 RIGHT (1 LEVEL) (Right)  Patient Location: PACU  Anesthesia Type:General  Level of Consciousness: awake, alert , oriented and patient cooperative  Airway & Oxygen Therapy: Patient Spontanous Breathing and Patient connected to face mask oxygen  Post-op Assessment: Report given to RN and Post -op Vital signs reviewed and stable  Post vital signs: Reviewed  Last Vitals:  Filed Vitals:   08/01/15 1056  BP: 119/78  Pulse: 108  Temp: 37.1 C  Resp: 18    Last Pain:  Filed Vitals:   08/01/15 1121  PainSc: 5          Complications: No apparent anesthesia complications

## 2015-08-01 NOTE — Anesthesia Preprocedure Evaluation (Addendum)
Anesthesia Evaluation  Patient identified by MRN, date of birth, ID band Patient awake    Reviewed: Allergy & Precautions, NPO status , Patient's Chart, lab work & pertinent test results  History of Anesthesia Complications Negative for: history of anesthetic complications  Airway Mallampati: II  TM Distance: >3 FB Neck ROM: Full    Dental no notable dental hx. (+) Dental Advisory Given, Caps   Pulmonary neg pulmonary ROS,    Pulmonary exam normal        Cardiovascular negative cardio ROS Normal cardiovascular exam     Neuro/Psych negative psych ROS   GI/Hepatic Neg liver ROS, GERD  ,  Endo/Other  negative endocrine ROS  Renal/GU negative Renal ROS     Musculoskeletal   Abdominal   Peds  Hematology   Anesthesia Other Findings   Reproductive/Obstetrics                            Anesthesia Physical Anesthesia Plan  ASA: II  Anesthesia Plan: General   Post-op Pain Management:    Induction: Intravenous  Airway Management Planned: Oral ETT  Additional Equipment:   Intra-op Plan:   Post-operative Plan: Extubation in OR  Informed Consent: I have reviewed the patients History and Physical, chart, labs and discussed the procedure including the risks, benefits and alternatives for the proposed anesthesia with the patient or authorized representative who has indicated his/her understanding and acceptance.   Dental advisory given  Plan Discussed with: CRNA, Anesthesiologist and Surgeon  Anesthesia Plan Comments:        Anesthesia Quick Evaluation

## 2015-08-01 NOTE — Anesthesia Procedure Notes (Signed)
Procedure Name: Intubation Date/Time: 08/01/2015 1:31 PM Performed by: Delphia GratesHANDLER, Liala Codispoti Pre-anesthesia Checklist: Suction available, Patient being monitored, Emergency Drugs available and Patient identified Patient Re-evaluated:Patient Re-evaluated prior to inductionOxygen Delivery Method: Circle system utilized Preoxygenation: Pre-oxygenation with 100% oxygen Intubation Type: IV induction Ventilation: Mask ventilation without difficulty Laryngoscope Size: Mac and 4 Grade View: Grade I Tube type: Oral Tube size: 7.5 mm Number of attempts: 1 Airway Equipment and Method: Stylet Placement Confirmation: ETT inserted through vocal cords under direct vision and positive ETCO2 Secured at: 21 cm Tube secured with: Tape Dental Injury: Teeth and Oropharynx as per pre-operative assessment

## 2015-08-01 NOTE — Discharge Instructions (Signed)
Walk As Tolerated utilizing back precautions.  No bending, twisting, or lifting.  No driving for 2 weeks.   °Aquacel dressing may remain in place until follow up. May shower with aquacel dressing in place. If the dressing peels off or becomes saturated, you may remove aquacel dressing and place gauze and tape dressing which should be kept clean and dry and changed daily. Do not remove steri-strips if they are present. °See Dr. Kharizma Lesnick in office in 10 to 14 days. Begin taking aspirin 81mg per day starting 4 days after your surgery if not allergic to aspirin or on another blood thinner. °Walk daily even outside. Use a cane or walker only if necessary. °Avoid sitting on soft sofas. ° °

## 2015-08-01 NOTE — Brief Op Note (Signed)
08/01/2015  2:25 PM  PATIENT:  [name] Bowman  40 y.o. female  PRE-OPERATIVE DIAGNOSIS:  HNP L5-S1 RIGHT   POST-OPERATIVE DIAGNOSIS:  HNP L5-S1 RIGHT   PROCEDURE:  Procedure(s): MICRO LUMBAR DECOMPRESSION L5-S1 RIGHT (1 LEVEL) (Right)  SURGEON:  Surgeon(s) and Role:    * Jene EveryJeffrey Meital Riehl, MD - Primary  PHYSICIAN ASSISTANT:   ASSISTANTS: Bissell   ANESTHESIA:   general  EBL:  Total I/O In: -  Out: 25 [Blood:25]  BLOOD ADMINISTERED:none  DRAINS: none   LOCAL MEDICATIONS USED:  MARCAINE     SPECIMEN:  Source of Specimen:  L5S1  DISPOSITION OF SPECIMEN:  PATHOLOGY  COUNTS:  YES  TOURNIQUET:  * No tourniquets in log *  DICTATION: .Other Dictation: Dictation Number [id number]  PLAN OF CARE: Admit for overnight observation  PATIENT DISPOSITION:  PACU - hemodynamically stable.   Delay start of Pharmacological VTE agent (>24hrs) due to surgical blood loss or risk of bleeding: yes

## 2015-08-01 NOTE — Evaluation (Signed)
Physical Therapy Evaluation Patient Details Name: [name] Bowman MRN: [id number] DOB: 04/25/1975 Today's Date: 08/01/2015   History of Present Illness  Pt s/p L5-S1 decompression  Clinical Impression  Pt s/p back surgery presents with post op back precautions and pain limiting functional mobility.  Pt should progress to dc home with family assist.    Follow Up Recommendations No PT follow up    Equipment Recommendations  None recommended by PT    Recommendations for Other Services OT consult     Precautions / Restrictions Precautions Precautions: Back;Fall Precaution Booklet Issued: Yes (comment) Precaution Comments: Back precautions reviewed x 2 Restrictions Weight Bearing Restrictions: No      Mobility  Bed Mobility Overal bed mobility: Needs Assistance Bed Mobility: Supine to Sit     Supine to sit: Min guard     General bed mobility comments: cues for correct log roll technique  Transfers Overall transfer level: Needs assistance Equipment used: None Transfers: Sit to/from Stand Sit to Stand: Min guard         General transfer comment: cues for adherence to back precautions and for use of UEs to self assist  Ambulation/Gait Ambulation/Gait assistance: Min guard Ambulation Distance (Feet): 400 Feet Assistive device: None Gait Pattern/deviations: Step-through pattern;Shuffle Gait velocity: decr Gait velocity interpretation: Below normal speed for age/gender General Gait Details: guarded movement but good stability  Stairs            Wheelchair Mobility    Modified Rankin (Stroke Patients Only)       Balance Overall balance assessment: Needs assistance Sitting-balance support: No upper extremity supported;Feet supported Sitting balance-Leahy Scale: Good     Standing balance support: No upper extremity supported Standing balance-Leahy Scale: Fair                               Pertinent Vitals/Pain Pain Assessment:  0-10 Pain Score: 2  Pain Location: back Pain Descriptors / Indicators: Aching;Sore Pain Intervention(s): Limited activity within patient's tolerance;Monitored during session;Premedicated before session    Home Living Family/patient expects to be discharged to:: Private residence Living Arrangements: Spouse/significant other Available Help at Discharge: Family Type of Home: House Home Access: Stairs to enter   Secretary/administratorntrance Stairs-Number of Steps: 1 Home Layout: One level Home Equipment: None      Prior Function Level of Independence: Independent         Comments: but I was walking around at a 90 degree angle since the 6th     Hand Dominance        Extremity/Trunk Assessment   Upper Extremity Assessment: Overall WFL for tasks assessed           Lower Extremity Assessment: Overall WFL for tasks assessed         Communication   Communication: No difficulties  Cognition Arousal/Alertness: Awake/alert Behavior During Therapy: WFL for tasks assessed/performed Overall Cognitive Status: Within Functional Limits for tasks assessed                      General Comments      Exercises        Assessment/Plan    PT Assessment Patient needs continued PT services  PT Diagnosis Difficulty walking   PT Problem List Decreased strength;Decreased range of motion;Decreased activity tolerance;Decreased mobility;Decreased knowledge of use of DME;Decreased knowledge of precautions;Pain  PT Treatment Interventions DME instruction;Gait training;Stair training;Functional mobility training;Therapeutic activities;Therapeutic exercise;Patient/family education   PT Goals (  Current goals can be found in the Care Plan section) Acute Rehab PT Goals Patient Stated Goal: Regain IND PT Goal Formulation: With patient Time For Goal Achievement: 08/02/15 Potential to Achieve Goals: Good    Frequency Min 6X/week   Barriers to discharge        Co-evaluation                End of Session   Activity Tolerance: Patient tolerated treatment well Patient left: in chair;with call bell/phone within reach Nurse Communication: Mobility status    Functional Assessment Tool Used: Clinical judgement Functional Limitation: Mobility: Walking and moving around Mobility: Walking and Moving Around Current Status (A5409): At least 1 percent but less than 20 percent impaired, limited or restricted Mobility: Walking and Moving Around Goal Status [phone]): At least 1 percent but less than 20 percent impaired, limited or restricted    Time: 4782-9562 PT Time Calculation (min) (ACUTE ONLY): 22 min   Charges:   PT Evaluation $PT Eval Low Complexity: 1 Procedure     PT G Codes:   PT G-Codes **NOT FOR INPATIENT CLASS** Functional Assessment Tool Used: Clinical judgement Functional Limitation: Mobility: Walking and moving around Mobility: Walking and Moving Around Current Status (Z3086): At least 1 percent but less than 20 percent impaired, limited or restricted Mobility: Walking and Moving Around Goal Status [phone]): At least 1 percent but less than 20 percent impaired, limited or restricted    [facility] 08/01/2015, 6:07 PM

## 2015-08-01 NOTE — Anesthesia Postprocedure Evaluation (Signed)
Anesthesia Post Note  Patient: Dorise BullionKimberly G Clair  Procedure(s) Performed: Procedure(s) (LRB): MICRO LUMBAR DECOMPRESSION L5-S1 RIGHT (1 LEVEL) (Right)  Patient location during evaluation: PACU Anesthesia Type: General Level of consciousness: sedated Pain management: pain level controlled Vital Signs Assessment: post-procedure vital signs reviewed and stable Respiratory status: spontaneous breathing and respiratory function stable Cardiovascular status: stable Anesthetic complications: no    Last Vitals:  Filed Vitals:   08/01/15 1500 08/01/15 1515  BP: 114/77 94/65  Pulse: 95 91  Temp:  36.4 C  Resp: 12 17    Last Pain:  Filed Vitals:   08/01/15 1531  PainSc: 5                  Shepherd Finnan DANIEL

## 2015-08-02 DIAGNOSIS — M4806 Spinal stenosis, lumbar region: Secondary | ICD-10-CM | POA: Diagnosis not present

## 2015-08-02 DIAGNOSIS — M5116 Intervertebral disc disorders with radiculopathy, lumbar region: Secondary | ICD-10-CM | POA: Diagnosis not present

## 2015-08-02 NOTE — Progress Notes (Signed)
Subjective: 1 Day Post-Op Procedure(s) (LRB): MICRO LUMBAR DECOMPRESSION L5-S1 RIGHT (1 LEVEL) (Right) Patient reports pain as mild.  Doing well. Tolerating Po's. Denies any bowel or bladder changes. Denies SOB,CP,or leg pain.   Objective: Vital signs in last 24 hours: Temp:  [97.5 F (36.4 C)-98.7 F (37.1 C)] 98.5 F (36.9 C) (07/22 0545) Pulse Rate:  [63-116] 93 (07/22 0545) Resp:  [12-20] 20 (07/22 0545) BP: (94-141)/(52-82) 114/66 mmHg (07/22 0545) SpO2:  [94 %-100 %] 96 % (07/22 0545) Weight:  [73.483 kg (162 lb)] 73.483 kg (162 lb) (07/21 1057)  Intake/Output from previous day: 07/21 0701 - 07/22 0700 In: 2161.7 [P.O.:540; I.V.:1621.7] Out: 1325 [Urine:1300; Blood:25] Intake/Output this shift: Total I/O In: 360 [P.O.:360] Out: -   No results for input(s): HGB in the last 72 hours. No results for input(s): WBC, RBC, HCT, PLT in the last 72 hours. No results for input(s): NA, K, CL, CO2, BUN, CREATININE, GLUCOSE, CALCIUM in the last 72 hours. No results for input(s): LABPT, INR in the last 72 hours.  Alert and oriented x3. RRR, Lungs clear, BS x4. Left Calf soft and non tender. Lumbar dressing C/D/I. No DVT signs. No signs of infection or compartment syndrome. BLE grossly neurovascularly intact.   Assessment/Plan: 1 Day Post-Op Procedure(s) (LRB): MICRO LUMBAR DECOMPRESSION L5-S1 RIGHT (1 LEVEL) (Right) Up with PT D/c home with family F/u in office with Dr.Beane Follow instructions  Janell Keeling L 08/02/2015, 9:17 AM

## 2015-08-02 NOTE — Progress Notes (Signed)
Physical Therapy Treatment Patient Details Name: [name] Bowman MRN: [id number] DOB: 01-21-1975 Today's Date: 08-12-2015    History of Present Illness Pt s/p L5-S1 decompression    PT Comments    Pt progressing well and eager for return home this date  Follow Up Recommendations  No PT follow up     Equipment Recommendations  None recommended by PT    Recommendations for Other Services OT consult     Precautions / Restrictions Precautions Precautions: Back Precaution Booklet Issued: Yes (comment) Precaution Comments: back precaution handout in room. Reviewed handout and pt able to verbalize 3/3 precautions. Restrictions Weight Bearing Restrictions: No    Mobility  Bed Mobility Overal bed mobility: Needs Assistance Bed Mobility: Supine to Sit     Supine to sit: Supervision     General bed mobility comments: Pt log rolled OOB unassisted  Transfers Overall transfer level: Needs assistance Equipment used: None Transfers: Sit to/from Stand Sit to Stand: Supervision         General transfer comment: cues for back precaution and use of UEs to self assist  Ambulation/Gait Ambulation/Gait assistance: Supervision;Independent Ambulation Distance (Feet): 450 Feet Assistive device: None Gait Pattern/deviations: WFL(Within Functional Limits)     General Gait Details: cues to slow pace   Stairs Stairs: Yes Stairs assistance: Min guard Stair Management: No rails;Forwards Number of Stairs: 1    Wheelchair Mobility    Modified Rankin (Stroke Patients Only)       Balance     Sitting balance-Leahy Scale: Good       Standing balance-Leahy Scale: Fair                      Cognition Arousal/Alertness: Awake/alert Behavior During Therapy: WFL for tasks assessed/performed Overall Cognitive Status: Within Functional Limits for tasks assessed                      Exercises      General Comments        Pertinent Vitals/Pain  Pain Assessment: 0-10 Pain Score: 4  Pain Location: back Pain Descriptors / Indicators: Aching;Sore Pain Intervention(s): Limited activity within patient's tolerance;Monitored during session    Home Living Family/patient expects to be discharged to:: Private residence Living Arrangements: Spouse/significant other Available Help at Discharge: Family Type of Home: House Home Access: Stairs to enter   Home Layout: One level Home Equipment: None      Prior Function Level of Independence: Independent      Comments: but I was walking around at a 90 degree angle since the 6th   PT Goals (current goals can now be found in the care plan section) Acute Rehab PT Goals Patient Stated Goal: home PT Goal Formulation: With patient Time For Goal Achievement: 12-Aug-2015 Potential to Achieve Goals: Good Progress towards PT goals: Progressing toward goals    Frequency  Min 6X/week    PT Plan Current plan remains appropriate    Co-evaluation             End of Session   Activity Tolerance: Patient tolerated treatment well Patient left: in chair;with call bell/phone within reach     Time: 0817-0830 PT Time Calculation (min) (ACUTE ONLY): 13 min  Charges:  $Gait Training: 8-22 mins                    G Codes:      Piero Mustard 12-Aug-2015, 1:24 PM

## 2015-08-02 NOTE — Care Management Note (Signed)
Case Management Note  Patient Details  Name: [name] Bowman MRN: [id number] Date of Birth: 10-30-1975  Subjective/Objective:                  L5-S1 decompression Action/Plan: Discharge planning Expected Discharge Date:  08/02/15               Expected Discharge Plan:  Home/Self Care  In-House Referral:     Discharge planning Services  CM Consult, NA  Post Acute Care Choice:  NA Choice offered to:  NA  DME Arranged:    DME Agency:  NA  HH Arranged:  NA HH Agency:  NA  Status of Service:     If discussed at Long Length of Stay Meetings, dates discussed:    Additional Comments: CM notes no HHPT/OT recc or ordered.  No DME recc or ordered.  NO other CM needs were communicated. Yves Dill, RN 08/02/2015, 11:24 AM

## 2015-08-02 NOTE — Evaluation (Addendum)
Occupational Therapy One Time Evaluation Patient Details Name: [name] Bowman MRN: [id number] DOB: November 15, 1975 Today's Date: 08/02/2015    History of Present Illness Pt s/p L5-S1 decompression   Clinical Impression   Pt with overall min guard to min assist with ADL. She needed min assist with periarea hygiene in order to not break her back precautions. Discussed AE options to help pt be more independent with periarea hygiene and LB bathing. Min guard assist for stepping over the tub. She needs intermittent cues for back precautions during ADL. Pt aware of back precautions and able to self correct as session progressed. All education completed and feel pt ok to d/c from OT standpoint.     Follow Up Recommendations  No OT follow up;Supervision - Intermittent    Equipment Recommendations  None recommended by OT    Recommendations for Other Services       Precautions / Restrictions Precautions Precautions: Back Precaution Booklet Issued: Yes (comment) Precaution Comments: back precaution handout in room. Reviewed handout and pt able to verbalize 3/3 precautions. Restrictions Weight Bearing Restrictions: No      Mobility Bed Mobility               General bed mobility comments: up in chair.   Transfers Overall transfer level: Needs assistance Equipment used: None Transfers: Sit to/from Stand Sit to Stand: Supervision         General transfer comment: cues for back precaution with rising from and descending to commode.     Balance                                            ADL Overall ADL's : Needs assistance/impaired Eating/Feeding: Independent;Sitting   Grooming: Wash/dry face;Supervision/safety;Standing   Upper Body Bathing: Set up;Supervision/ safety;Sitting   Lower Body Bathing: Minimal assistance;Sit to/from stand Lower Body Bathing Details (indicate cue type and reason): without AE for posterior periarea. Upper Body Dressing :  Supervision/safety;Set up;Standing   Lower Body Dressing: Supervision/safety;Sit to/from stand   Toilet Transfer: Supervision/safety;Ambulation;Comfort height toilet;Grab bars   Toileting- Clothing Manipulation and Hygiene: Sit to/from stand;Minimal assistance   Tub/ Shower Transfer: Min guard;Tub transfer     General ADL Comments: Pt with difficulty performing posterior periarea hygiene without breaking precautions so educated on AE for toileting hygiene and coverage. Also discussed LHS for showering to help wtih LB. Reviewed handout with precautions and how precautions apply to ADL. Pt needed intermittent cues to not bend or twist during ADL. Discussed use of shower chair she has available to make it easier to wash LEs.      Vision     Perception     Praxis      Pertinent Vitals/Pain Pain Assessment: 0-10 Pain Score: 5  Pain Location: back Pain Descriptors / Indicators: Sore Pain Intervention(s): Monitored during session;Repositioned     Hand Dominance     Extremity/Trunk Assessment Upper Extremity Assessment Upper Extremity Assessment: Overall WFL for tasks assessed           Communication Communication Communication: No difficulties   Cognition Arousal/Alertness: Awake/alert Behavior During Therapy: WFL for tasks assessed/performed Overall Cognitive Status: Within Functional Limits for tasks assessed                     General Comments       Exercises       Shoulder Instructions  Home Living Family/patient expects to be discharged to:: Private residence Living Arrangements: Spouse/significant other Available Help at Discharge: Family Type of Home: House Home Access: Stairs to enter Secretary/administrator of Steps: 1   Home Layout: One level     Bathroom Shower/Tub: Chief Strategy Officer: Handicapped height     Home Equipment: None          Prior Functioning/Environment Level of Independence: Independent         Comments: but I was walking around at a 90 degree angle since the 6th    OT Diagnosis: Generalized weakness   OT Problem List:     OT Treatment/Interventions:      OT Goals(Current goals can be found in the care plan section) Acute Rehab OT Goals Patient Stated Goal: home OT Goal Formulation: With patient  OT Frequency:     Barriers to D/C:            Co-evaluation              End of Session    Activity Tolerance: Patient tolerated treatment well Patient left: in chair;with call bell/phone within reach   Time: 0835-0907 OT Time Calculation (min): 32 min Charges:  OT General Charges $OT Visit: 1 Procedure OT Evaluation $OT Eval Low Complexity: 1 Procedure OT Treatments $Self Care/Home Management : 8-22 mins G-Codes: OT G-codes **NOT FOR INPATIENT CLASS** Functional Assessment Tool Used: clinical judgement Functional Limitation: Self care Self Care Current Status (V2536): At least 1 percent but less than 20 percent impaired, limited or restricted Self Care Goal Status (U4403): At least 1 percent but less than 20 percent impaired, limited or restricted Self Care Discharge Status [phone]): At least 1 percent but less than 20 percent impaired, limited or restricted  Lennox Laity 08/02/2015, 10:39 AM

## 2015-08-02 NOTE — Progress Notes (Signed)
Discharged from floor via w/c for transport home by car. Belongings & spouse with pt. No changes in assessment. Doreen Garretson  

## 2015-08-18 DIAGNOSIS — M5126 Other intervertebral disc displacement, lumbar region: Secondary | ICD-10-CM | POA: Diagnosis not present

## 2015-09-08 DIAGNOSIS — Z4789 Encounter for other orthopedic aftercare: Secondary | ICD-10-CM | POA: Diagnosis not present

## 2015-10-17 ENCOUNTER — Encounter: Payer: Self-pay | Admitting: Family Medicine

## 2015-10-21 MED ORDER — ZOLPIDEM TARTRATE 10 MG PO TABS: 10.0000 mg | ORAL_TABLET | Freq: Every evening | ORAL | 1 refills | Status: DC | PRN

## 2015-10-21 NOTE — Telephone Encounter (Signed)
Call in Zolpidem #[phone] with one rf. Please remove the "may fill on" notation

## 2015-11-03 DIAGNOSIS — Z4789 Encounter for other orthopedic aftercare: Secondary | ICD-10-CM | POA: Diagnosis not present

## 2015-11-03 DIAGNOSIS — Z9889 Other specified postprocedural states: Secondary | ICD-10-CM | POA: Diagnosis not present

## 2015-11-24 DIAGNOSIS — Z6828 Body mass index (BMI) 28.0-28.9, adult: Secondary | ICD-10-CM | POA: Diagnosis not present

## 2015-11-24 DIAGNOSIS — K648 Other hemorrhoids: Secondary | ICD-10-CM | POA: Diagnosis not present

## 2015-11-24 DIAGNOSIS — Z01419 Encounter for gynecological examination (general) (routine) without abnormal findings: Secondary | ICD-10-CM | POA: Diagnosis not present

## 2016-01-16 ENCOUNTER — Encounter: Payer: Self-pay | Admitting: Family Medicine

## 2016-01-16 ENCOUNTER — Ambulatory Visit (INDEPENDENT_AMBULATORY_CARE_PROVIDER_SITE_OTHER): Payer: BLUE CROSS/BLUE SHIELD | Admitting: Family Medicine

## 2016-01-16 VITALS — BP 110/80 | HR 99 | Temp 98.3°F | Ht 64.5 in | Wt 171.2 lb

## 2016-01-16 DIAGNOSIS — J069 Acute upper respiratory infection, unspecified: Secondary | ICD-10-CM | POA: Diagnosis not present

## 2016-01-16 DIAGNOSIS — B9789 Other viral agents as the cause of diseases classified elsewhere: Secondary | ICD-10-CM

## 2016-01-16 DIAGNOSIS — H6983 Other specified disorders of Eustachian tube, bilateral: Secondary | ICD-10-CM

## 2016-01-16 NOTE — Progress Notes (Signed)
Pre visit review using our clinic review tool, if applicable. No additional management support is needed unless otherwise documented below in the visit note. 

## 2016-01-16 NOTE — Progress Notes (Signed)
HPI:  URI: -started: 4-5 days ago -symptoms:nasal congestion, PND, occ HA, L ear fullness/pressure, mild cough -denies:fever, SOB, NVD, tooth pain, sinus pain, body aches -has tried: musinex D - makes her a little loopy and feels like it dries out sinuses too much; nyquil -sick contacts/travel/risks: no reported flu, strep or tick exposure -does not get flu shots  ROS: See pertinent positives and negatives per HPI.  Past Medical History:  Diagnosis Date  . ANEMIA-NOS 03/29/2007  . Asymptomatic PVCs 01-21-11   worked up by Dr. Kenna Gilbert in 2009 with ECHO (benign), then saw Dr. Merrily Pew for exam (benign)  . CONSTIPATION 05/16/2008  . GERD 05/13/2008  . Headache(784.0) 05/12/2007  . History of exercise stress test 08-20-13   normal   . HYPERLIPIDEMIA 03/29/2007  . INSOMNIA 03/29/2007  . Patellar tendinitis 05/31/2008    Past Surgical History:  Procedure Laterality Date  . HAMMER TOE SURGERY  2012   right 4th and 5th toes  . LUMBAR LAMINECTOMY/DECOMPRESSION MICRODISCECTOMY Right 08/01/2015   Procedure: MICRO LUMBAR DECOMPRESSION L5-S1 RIGHT (1 LEVEL);  Surgeon: Jene Every, MD;  Location: [facility];  Service: Orthopedics;  Laterality: Right;  . mole removed from tear duct as a child       Family History  Problem Relation Age of Onset  . Breast cancer Mother   . Breast cancer Maternal Grandmother   . Breast cancer Maternal Aunt   . Ovarian cancer Maternal Aunt   . Diabetes Father   . Heart disease Father 72  . Colon cancer Neg Hx     Social History   Social History  . Marital status: Single    Spouse name: N/A  . Number of children: 0  . Years of education: N/A   Occupational History  . flight attendant [employer]   Social History Main Topics  . Smoking status: Never Smoker  . Smokeless tobacco: Never Used  . Alcohol use 0.6 oz/week    1 Standard drinks or equivalent per week     Comment: 1-2 glassses of wine or beer per week   . Drug use: No  . Sexual  activity: Yes   Other Topics Concern  . None   Social History Narrative  . None     Current Outpatient Prescriptions:  Marland Kitchen  Multiple Vitamin (MULTIVITAMINS PO), Take by mouth., Disp: , Rfl:  .  Probiotic Product (PROBIOTIC DAILY PO), Take 1 capsule by mouth daily. , Disp: , Rfl:  .  zolpidem (AMBIEN) 10 MG tablet, Take 1 tablet (10 mg total) by mouth at bedtime as needed for sleep., Disp: 90 tablet, Rfl: 1  EXAM:  Vitals:   01/16/16 0954  BP: 110/80  Pulse: 99  Temp: 98.3 F (36.8 C)    Body mass index is 28.93 kg/m.  GENERAL: vitals reviewed and listed above, alert, oriented, appears well hydrated and in no acute distress  HEENT: atraumatic, conjunttiva clear, no obvious abnormalities on inspection of external nose and ears, normal appearance of ear canals and TMs except for clear effusion bilat with mild retraction L TM, clear nasal congestion, mild post oropharyngeal erythema with PND, no tonsillar edema or exudate, no sinus TTP  NECK: no obvious masses on inspection  LUNGS: clear to auscultation bilaterally, no wheezes, rales or rhonchi, good air movement  CV: HRRR, no peripheral edema  MS: moves all extremities without noticeable abnormality  PSYCH: pleasant and cooperative, no obvious depression or anxiety  ASSESSMENT AND PLAN:  Discussed the following assessment and  plan:  Upper respiratory infection, viral  Dysfunction of both eustachian tubes  -given HPI and exam findings today, a serious infection or illness is unlikely. We discussed potential etiologies, with VURI and ETD being most likely. Opted to treat with short course nasal decongestant, INS, humidified air at night and saline. We discussed treatment side effects, likely course, antibiotic misuse, transmission, potential complications, return precautions and signs of developing a worsening or serious illness. -of course, we advised to return or notify a doctor immediately if symptoms worsen or persist  or new concerns arise.    Patient Instructions  Start AFRIN nasal spray twice daily for 4 days. Do not use longer the 4 days.  Flonase 2 sprays each nostril daily for 3-4 weeks.  Can use aleve or tylenol per instructions as needed  Humidifier at night - follow cleaning instructions carefully  Saline nasal spray per instructions  Follow up if worsening, new symptoms or you are not improving as we discussed  I hope you are feeling better soon!   Kriste BasqueKIM, Jahniah Pallas R., DO

## 2016-01-16 NOTE — Patient Instructions (Addendum)
Start AFRIN nasal spray twice daily for 4 days. Do not use longer the 4 days.  Flonase 2 sprays each nostril daily for 3-4 weeks.  Can use aleve or tylenol per instructions as needed  Humidifier at night - follow cleaning instructions carefully  Saline nasal spray per instructions  Follow up if worsening, new symptoms or you are not improving as we discussed  I hope you are feeling better soon!

## 2016-02-23 ENCOUNTER — Ambulatory Visit: Payer: Self-pay | Admitting: Surgery

## 2016-02-23 DIAGNOSIS — Z01818 Encounter for other preprocedural examination: Secondary | ICD-10-CM | POA: Diagnosis not present

## 2016-02-23 DIAGNOSIS — R198 Other specified symptoms and signs involving the digestive system and abdomen: Secondary | ICD-10-CM | POA: Diagnosis not present

## 2016-02-23 NOTE — H&P (Signed)
[name] Bowman 02/23/2016 10:22 AM Location: [facility] Patient #: [id number] DOB: 25-Apr-1975 Single / Language: [place] / Race: White Female  History of Present Illness [name] Sportsman [name] Bowman; 02/23/2016 4:15 PM) The patient is a 41 year old female who presents with hemorrhoids. Note for "Hemorrhoids": Patient sent for surgical evaluation by Ms. [name] Bowman, nurse practitioner for [facility]. Concern for symptomatic external hemorrhoids.  Pleasant active woman. His had issues with perianal itching and irritation. Concern for external hemorrhoid. History of some heartburn issues. Apparently a great grandfather had esophagogastric problems and may have had cancer. She had an endoscopy done over 5 years ago that was underwhelming. She takes an occasional antiacid meds. History of some mild constipation. Usually held with a probiotic. Doesn't like to take any fiber bowel regimen. 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 She's been try to use over-the-counter medications without much help. Medicinal wipes have not really helped. Discussed with her gynecologist. Surgical consultation offered.  He's never had a colonoscopy. No personal nor family history of GI/colon cancer, inflammatory bowel disease, irritable bowel syndrome, allergy such as Celiac Sprue, dietary/dairy problems, colitis, ulcers nor gastritis. No recent sick contacts/gastroenteritis. No travel outside the country. No changes in diet. No dysphagia to solids or liquids. No significant heartburn or reflux. No hematochezia, hematemesis, coffee ground emesis. No evidence of prior gastric/peptic ulceration.   Past Surgical History ([name] Bowman, [place]; 02/23/2016 10:23 AM) Oral Surgery Spinal Surgery - Lower Back  Diagnostic Studies History ([name] Bowman, [place]; 02/23/2016 10:23 AM) Colonoscopy never Mammogram 1-3 years ago Pap Smear 1-5 years ago  Allergies ([name] Bowman, [name] Bowman;  02/23/2016 10:23 AM) Sulfonamide Derivatives  Medication History ([name] Bowman, [name] Bowman; 02/23/2016 10:25 AM) Multiple Vitamin (Oral) Active. Probiotic Daily (Oral) Active. Medications Reconciled  Social History ([name] Bowman, [name] Bowman; 02/23/2016 10:23 AM) Alcohol use Occasional alcohol use. Caffeine use Coffee, Tea. No drug use Tobacco use Never smoker.  Family History ([name] Bowman, [place]; 02/23/2016 10:23 AM) Breast Cancer Mother. Cancer Mother. Diabetes Mellitus Father. Heart Disease Father. Heart disease in female family member before age 53 Hypertension Mother. Melanoma Mother. Respiratory Condition Mother. Thyroid problems Mother.  Pregnancy / Birth History ([name] [name] Bowman, [place]; 02/23/2016 10:23 AM) Age at menarche 11 years. Gravida 0 Irregular periods Para 0  Other Problems ([name] Bowman, [name] Bowman; 02/23/2016 10:23 AM) Back Pain Gastroesophageal Reflux Disease Hemorrhoids Hypercholesterolemia     Review of Systems ([name] Bowman [name] Bowman; 02/23/2016 10:23 AM) General Not Present- Appetite Loss, Chills, Fatigue, Fever, Night Sweats, Weight Gain and Weight Loss. Skin Not Present- Change in Wart/Mole, Dryness, Hives, Jaundice, New Lesions, Non-Healing Wounds, Rash and Ulcer. HEENT Not Present- Earache, Hearing Loss, Hoarseness, Nose Bleed, Oral Ulcers, Ringing in the Ears, Seasonal Allergies, Sinus Pain, Sore Throat, Visual Disturbances, Wears glasses/contact lenses and Yellow Eyes. Respiratory Not Present- Bloody sputum, Chronic Cough, Difficulty Breathing, Snoring and Wheezing. Breast Not Present- Breast Mass, Breast Pain, Nipple Discharge and Skin Changes. Cardiovascular Not Present- Chest Pain, Difficulty Breathing Lying Down, Leg Cramps, Palpitations, Rapid Heart Rate, Shortness of Breath and Swelling of Extremities. Gastrointestinal Present- Hemorrhoids. Not Present- Abdominal Pain, Bloating, Bloody Stool, Change in Bowel Habits, Chronic diarrhea, Constipation,  Difficulty Swallowing, Excessive gas, Gets full quickly at meals, Indigestion, Nausea, Rectal Pain and Vomiting. Female Genitourinary Not Present- Frequency, Nocturia, Painful Urination, Pelvic Pain and Urgency. Musculoskeletal Present- Back Pain. Not Present- Joint Pain, Joint Stiffness, Muscle Pain, Muscle Weakness and Swelling of Extremities. Neurological Present- Tingling. Not Present- Decreased Memory, Fainting,  Headaches, Numbness, Seizures, Tremor, Trouble walking and Weakness. Psychiatric Not Present- Anxiety, Bipolar, Change in Sleep Pattern, Depression, Fearful and Frequent crying. Endocrine Not Present- Cold Intolerance, Excessive Hunger, Hair Changes, Heat Intolerance, Hot flashes and New Diabetes. Hematology Not Present- Blood Thinners, Easy Bruising, Excessive bleeding, Gland problems, HIV and Persistent Infections.  Vitals ([name] Bowman [name] Bowman; 02/23/2016 10:25 AM) 02/23/2016 10:25 AM Weight: 164.38 lb Height: 64.5in Body Surface Area: 1.81 m Body Mass Index: 27.78 kg/m  Temp.: 98.39F(Oral)  Pulse: 85 (Regular)  BP: 120/78 (Sitting, Left Arm, Standard)      Physical Exam [name] Bowman([name] Felter C. Kauan Kloosterman [name] Bowman; 02/23/2016 10:53 AM)  General Mental Status-Alert. General Appearance-Not in acute distress, Not Sickly. Orientation-Oriented X3. Hydration-Well hydrated. Voice-Normal.  Integumentary Global Assessment Upon inspection and palpation of skin surfaces of the - Axillae: non-tender, no inflammation or ulceration, no drainage. and Distribution of scalp and body hair is normal. General Characteristics Temperature - normal warmth is noted.  Head and Neck Head-normocephalic, atraumatic with no lesions or palpable masses. Face Global Assessment - atraumatic, no absence of expression. Neck Global Assessment - no abnormal movements, no bruit auscultated on the right, no bruit auscultated on the left, no decreased range of motion,  non-tender. Trachea-midline. Thyroid Gland Characteristics - non-tender.  Eye Eyeball - Left-Extraocular movements intact, No Nystagmus. Eyeball - Right-Extraocular movements intact, No Nystagmus. Cornea - Left-No Hazy. Cornea - Right-No Hazy. Sclera/Conjunctiva - Left-No scleral icterus, No Discharge. Sclera/Conjunctiva - Right-No scleral icterus, No Discharge. Pupil - Left-Direct reaction to light normal. Pupil - Right-Direct reaction to light normal.  ENMT Ears Pinna - Left - no drainage observed, no generalized tenderness observed. Right - no drainage observed, no generalized tenderness observed. Nose and Sinuses External Inspection of the Nose - no destructive lesion observed. Inspection of the nares - Left - quiet respiration. Right - quiet respiration. Mouth and Throat Lips - Upper Lip - no fissures observed, no pallor noted. Lower Lip - no fissures observed, no pallor noted. Nasopharynx - no discharge present. Oral Cavity/Oropharynx - Tongue - no dryness observed. Oral Mucosa - no cyanosis observed. Hypopharynx - no evidence of airway distress observed.  Chest and Lung Exam Inspection Movements - Normal and Symmetrical. Accessory muscles - No use of accessory muscles in breathing. Palpation Palpation of the chest reveals - Non-tender. Auscultation Breath sounds - Normal and Clear.  Cardiovascular Auscultation Rhythm - Regular. Murmurs & Other Heart Sounds - Auscultation of the heart reveals - No Murmurs and No Systolic Clicks.  Abdomen Inspection Inspection of the abdomen reveals - No Visible peristalsis and No Abnormal pulsations. Umbilicus - No Bleeding, No Urine drainage. Palpation/Percussion Palpation and Percussion of the abdomen reveal - Soft, Non Tender, No Rebound tenderness, No Rigidity (guarding) and No Cutaneous hyperesthesia.  Female Genitourinary Sexual Maturity Tanner 5 - Adult hair pattern. Note: No vaginal bleeding nor  discharge  Rectal Note: See anoscopy results. Right anterior perianal smooth 7 mm external nodule.  Peripheral Vascular Upper Extremity Inspection - Left - No Cyanotic nailbeds, Not Ischemic. Right - No Cyanotic nailbeds, Not Ischemic.  Neurologic Neurologic evaluation reveals -normal attention span and ability to concentrate, able to name objects and repeat phrases. Appropriate fund of knowledge , normal sensation and normal coordination. Mental Status Affect - not angry, not paranoid. Cranial Nerves-Normal Bilaterally. Gait-Normal.  Neuropsychiatric Mental status exam performed with findings of-able to articulate well with normal speech/language, rate, volume and coherence, thought content normal with ability to perform basic computations and apply abstract reasoning and no evidence  of hallucinations, delusions, obsessions or homicidal/suicidal ideation.  Musculoskeletal Global Assessment Spine, Ribs and Pelvis - no instability, subluxation or laxity. Right Upper Extremity - no instability, subluxation or laxity.  Lymphatic Head & Neck  General Head & Neck Lymphatics: Bilateral - Description - No Localized lymphadenopathy. Axillary  General Axillary Region: Bilateral - Description - No Localized lymphadenopathy. Femoral & Inguinal  Generalized Femoral & Inguinal Lymphatics: Left - Description - No Localized lymphadenopathy. Right - Description - No Localized lymphadenopathy.    Assessment & Plan [name] Sportsman [name] Bowman; 02/23/2016 11:04 AM)  PERIANAL MASS (R19.8) Impression: Smooth perianal fibrotic nodule of uncertain etiology. Most likely atypical hemorrhoid or prolapsing anal canal polyp. Not consistent with cancer wart.  Current Plans ANOSCOPY, DIAGNOSTIC (78295) The anatomy & physiology of the anorectal region was discussed. The pathophysiology of hemorrhoids and differential diagnosis was discussed. Natural history risks without surgery was discussed. I  stressed the importance of a bowel regimen to have daily soft bowel movements to minimize progression of disease. Interventions such as sclerotherapy & banding were discussed.  The patient's symptoms are not adequately controlled by medicines and other non-operative treatments. I feel the risks & problems of no surgery outweigh the operative risks; therefore, I recommended surgery to treat the hemorrhoids by ligation, pexy, and possible resection.  Risks such as bleeding, infection, urinary difficulties, need for further treatment, heart attack, death, and other risks were discussed. I noted a good likelihood this will help address the problem. Goals of post-operative recovery were discussed as well. Possibility that this will not correct all symptoms was explained. Post-operative pain, bleeding, constipation, and other problems after surgery were discussed. We will work to minimize complications. Educational handouts further explaining the pathology, treatment options, and bowel regimen were given as well. Questions were answered. The patient expresses understanding & wishes to proceed with surgery.  ENCOUNTER FOR PREOPERATIVE EXAMINATION FOR GENERAL SURGICAL PROCEDURE (Z01.818)  Current Plans You are being scheduled for surgery- Our schedulers will call you.  You should hear from our office's scheduling department within 5 working days about the location, date, and time of surgery. We try to make accommodations for patient's preferences in scheduling surgery, but sometimes the OR schedule or the surgeon's schedule prevents [place] from making those accommodations.  If you have not heard from our office [phone]) in 5 working days, call the office and ask for your surgeon's nurse.  If you have other questions about your diagnosis, plan, or surgery, call the office and ask for your surgeon's nurse.  Pt Education - [facility] Rectal Prep for Anorectal outpatient/office surgery: discussed  with patient and provided information. Pt Education - [facility] Rectal Surgery HCI ([place]): discussed with patient and provided information.  [name] Bowman, M.D., F.A.BowmanS. Gastrointestinal and Minimally Invasive Surgery [facility] [address] [place], [place] [address] [phone] Main / Paging

## 2016-04-12 ENCOUNTER — Ambulatory Visit (INDEPENDENT_AMBULATORY_CARE_PROVIDER_SITE_OTHER): Payer: BLUE CROSS/BLUE SHIELD | Admitting: Family Medicine

## 2016-04-12 ENCOUNTER — Encounter: Payer: Self-pay | Admitting: Family Medicine

## 2016-04-12 ENCOUNTER — Ambulatory Visit (INDEPENDENT_AMBULATORY_CARE_PROVIDER_SITE_OTHER)
Admission: RE | Admit: 2016-04-12 | Discharge: 2016-04-12 | Disposition: A | Discharge status: Home / Self Care | Payer: BLUE CROSS/BLUE SHIELD | Source: Ambulatory Visit | Attending: Family Medicine | Admitting: Family Medicine

## 2016-04-12 VITALS — BP 100/80 | HR 94 | Temp 98.4°F | Ht 64.5 in | Wt 165.0 lb

## 2016-04-12 DIAGNOSIS — S90121A Contusion of right lesser toe(s) without damage to nail, initial encounter: Secondary | ICD-10-CM

## 2016-04-12 DIAGNOSIS — S93124A Dislocation of metatarsophalangeal joint of right lesser toe(s), initial encounter: Secondary | ICD-10-CM | POA: Diagnosis not present

## 2016-04-12 NOTE — Progress Notes (Signed)
Pre visit review using our clinic review tool, if applicable. No additional management support is needed unless otherwise documented below in the visit note. 

## 2016-04-12 NOTE — Progress Notes (Signed)
   Subjective:    Patient ID: [name] Bowman, female    DOB: 04-19-1975, 40 y.o.   MRN: [id number]  HPI Here to check the 4th toe on the right foot. On 04-10-16 while she was in a hotel room during a layover between flights, she tripped over a hair dryer cord and injured the toe. She soaked it in warm water and iced it. She wore compression hose, as usual. She worked the flights back home (from [place], [place]) and arrived home last night. Today the toe is sore but feels much better. This is the same toe where she had a hammertoe repair a few years ago.   Review of Systems  Constitutional: Negative.   Musculoskeletal: Positive for arthralgias and joint swelling.       Objective:   Physical Exam  Constitutional: She appears well-developed and well-nourished.  Musculoskeletal:  The right 4th toe is slightly ecchymotic. Alignment is normal. Full ROM with no crepitus. She is mildly tender along the distal phalanx.          Assessment & Plan:  Toe injury, most likely not a fracture. She will get an Xray today to be sure. Continue staying off the foot if possible and wearing supportive shoes.  Gershon Crane, MD

## 2016-05-27 ENCOUNTER — Telehealth: Payer: BLUE CROSS/BLUE SHIELD | Admitting: Nurse Practitioner

## 2016-05-27 DIAGNOSIS — A09 Infectious gastroenteritis and colitis, unspecified: Secondary | ICD-10-CM

## 2016-05-27 MED ORDER — AZITHROMYCIN 500 MG PO TABS: 500.0000 mg | ORAL_TABLET | Freq: Every day | ORAL | 0 refills | Status: AC

## 2016-05-27 NOTE — Progress Notes (Signed)
We are sorry that you are not feeling well.  Here is how we plan to help!  Based on what you have shared with me it looks like you have Acute Infectious Diarrhea.  Most cases of acute diarrhea are due to infections with virus and bacteria and are self-limited conditions lasting less than 14 days.  For your symptoms you may take Imodium 2 mg tablets that are over the counter at your local pharmacy. Take two tablet now and then one after each loose stool up to 6 a day.    Optional: I have sent Azithromycin 500 mg daily for 3 days.  HOME CARE  We recommend changing your diet to help with your symptoms for the next few days.  Drink plenty of fluids that contain water salt and sugar. Sports drinks such as Gatorade may help.   You may try broths, soups, bananas, applesauce, soft breads, mashed potatoes or crackers.   You are considered infectious for as long as the diarrhea continues. Hand washing or use of alcohol based hand sanitizers is recommend.  It is best to stay out of work or school until your symptoms stop.   GET HELP RIGHT AWAY  If you have dark yellow colored urine or do not pass urine frequently you should drink more fluids.    If your symptoms worsen   If you feel like you are going to pass out (faint)  You have a new problem  MAKE SURE YOU   Understand these instructions.  Will watch your condition.  Will get help right away if you are not doing well or get worse.  Your e-visit answers were reviewed by a board certified advanced clinical practitioner to complete your personal care plan.  Depending on the condition, your plan could have included both over the counter or prescription medications.  If there is a problem please reply  once you have received a response from your provider.  Your safety is important to us.  If you have drug allergies check your prescription carefully.    You can use MyChart to ask questions about today's visit, request a non-urgent  call back, or ask for a work or school excuse for 24 hours related to this e-Visit. If it has been greater than 24 hours you will need to follow up with your provider, or enter a new e-Visit to address those concerns.   You will get an e-mail in the next two days asking about your experience.  I hope that your e-visit has been valuable and will speed your recovery. Thank you for using e-visits.

## 2016-07-21 ENCOUNTER — Ambulatory Visit (INDEPENDENT_AMBULATORY_CARE_PROVIDER_SITE_OTHER): Payer: BLUE CROSS/BLUE SHIELD | Admitting: Family Medicine

## 2016-07-21 ENCOUNTER — Encounter: Payer: Self-pay | Admitting: Family Medicine

## 2016-07-21 VITALS — BP 98/54 | Temp 98.3°F | Ht 64.5 in | Wt 167.0 lb

## 2016-07-21 DIAGNOSIS — J018 Other acute sinusitis: Secondary | ICD-10-CM | POA: Diagnosis not present

## 2016-07-21 MED ORDER — AMOXICILLIN-POT CLAVULANATE 875-125 MG PO TABS: 1.0000 | ORAL_TABLET | Freq: Two times a day (BID) | ORAL | 0 refills | Status: DC

## 2016-07-21 NOTE — Patient Instructions (Signed)
WE NOW OFFER   [facility] FAST TRACK!!!  SAME DAY Appointments for ACUTE CARE  Such as: Sprains, Injuries, cuts, abrasions, rashes, muscle pain, joint pain, back pain Colds, flu, sore throats, headache, allergies, cough, fever  Ear pain, sinus and eye infections Abdominal pain, nausea, vomiting, diarrhea, upset stomach Animal/insect bites  3 Easy Ways to Schedule: Walk-In Scheduling Call in scheduling Mychart Sign-up: [email or web address]         

## 2016-07-21 NOTE — Progress Notes (Signed)
   Subjective:    Patient ID: Dorise BullionKimberly G Gavin, female    DOB: 08/21/1975, 41 y.o.   MRN: [id number]  HPI Here for one week of sinus pressure, PND, ST, haorseness and a dry cough. On Nyquil.   Review of Systems  Constitutional: Negative.   HENT: Positive for congestion, postnasal drip, sinus pain, sinus pressure and sore throat.   Eyes: Negative.   Respiratory: Positive for cough.        Objective:   Physical Exam  Constitutional: She appears well-developed and well-nourished.  HENT:  Right Ear: External ear normal.  Left Ear: External ear normal.  Nose: Nose normal.  Mouth/Throat: Oropharynx is clear and moist.  Eyes: Conjunctivae are normal.  Neck: No thyromegaly present.  Pulmonary/Chest: Effort normal and breath sounds normal. No respiratory distress. She has no wheezes. She has no rales.  Lymphadenopathy:    She has no cervical adenopathy.          Assessment & Plan:  Sinusitis, treat with Augmentin. Gershon CraneStephen Sacred Roa, MD

## 2016-07-23 ENCOUNTER — Telehealth: Payer: Self-pay

## 2016-07-23 NOTE — Telephone Encounter (Signed)
Patient called to c/o ongoing issues with diarrhea. She states that she did have this when she came in for OV for sinus issues but forgot to mention. She does travel out of the country frequently, but is aware of safe food consumption practices when travelling. She did forget her pro-biotic on a recent trip and it was after this that she developed the diarrhea. She did take some Imodium with improvement in symptoms, she has also resumed her pro-biotic daily. She states that once she started the antibiotic her symptoms returned. She denies any f/n/v or abdominal pain. Advised pt to continue pro-biotic, Imodium and add yogurt and powdered fiber supplement to add bulk to stool. She will try these and contact office if not improving, also advised pt to seek UC if symptoms worsen or she develops f/n/v/abd pain. She agrees. Nothing further needed at this time.

## 2016-08-12 ENCOUNTER — Telehealth: Payer: Self-pay
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
## 2016-08-14 ENCOUNTER — Emergency Department (INDEPENDENT_AMBULATORY_CARE_PROVIDER_SITE_OTHER)
Admission: EM | Admit: 2016-08-14 | Discharge: 2016-08-14 | Disposition: A | Discharge status: Home / Self Care | Payer: BLUE CROSS/BLUE SHIELD | Source: Home / Self Care | Attending: Family Medicine | Admitting: Family Medicine

## 2016-08-14 ENCOUNTER — Encounter: Payer: Self-pay | Admitting: Emergency Medicine

## 2016-08-14 DIAGNOSIS — M79601 Pain in right arm: Secondary | ICD-10-CM

## 2016-08-14 MED ORDER — MELOXICAM 15 MG PO TABS: 15.0000 mg | ORAL_TABLET | Freq: Every day | ORAL | 1 refills | Status: DC
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
## 2016-08-14 NOTE — Discharge Instructions (Signed)
Apply ice pack for 10 to 15 minutes, 3 to 4 times daily  Continue until pain decreases.  Begin range of motion and stretching exercises as tolerated.

## 2016-08-18 DIAGNOSIS — M5136 Other intervertebral disc degeneration, lumbar region: Secondary | ICD-10-CM | POA: Diagnosis not present

## 2016-08-18 DIAGNOSIS — M5416 Radiculopathy, lumbar region: Secondary | ICD-10-CM | POA: Diagnosis not present

## 2016-09-30 ENCOUNTER — Encounter: Payer: Self-pay | Admitting: Family Medicine

## 2016-10-18 ENCOUNTER — Encounter: Payer: Self-pay | Admitting: Family Medicine

## 2016-10-18 ENCOUNTER — Other Ambulatory Visit: Payer: Self-pay | Admitting: Obstetrics and Gynecology

## 2016-10-18 DIAGNOSIS — Z1239 Encounter for other screening for malignant neoplasm of breast: Secondary | ICD-10-CM

## 2016-10-19 NOTE — Telephone Encounter (Signed)
Refill for 6 months. 

## 2016-10-21 ENCOUNTER — Other Ambulatory Visit: Payer: Self-pay | Admitting: Family Medicine

## 2016-10-21 MED ORDER — ZOLPIDEM TARTRATE 10 MG PO TABS: 10.0000 mg | ORAL_TABLET | Freq: Every evening | ORAL | 1 refills | Status: DC | PRN

## 2016-11-05 DIAGNOSIS — N926 Irregular menstruation, unspecified: Secondary | ICD-10-CM | POA: Diagnosis not present

## 2016-11-05 DIAGNOSIS — N951 Menopausal and female climacteric states: Secondary | ICD-10-CM | POA: Diagnosis not present

## 2016-11-08 DIAGNOSIS — Z1321 Encounter for screening for nutritional disorder: Secondary | ICD-10-CM | POA: Diagnosis not present

## 2016-11-08 DIAGNOSIS — Z1329 Encounter for screening for other suspected endocrine disorder: Secondary | ICD-10-CM | POA: Diagnosis not present

## 2016-11-08 DIAGNOSIS — Z13220 Encounter for screening for lipoid disorders: Secondary | ICD-10-CM | POA: Diagnosis not present

## 2016-11-08 DIAGNOSIS — Z13228 Encounter for screening for other metabolic disorders: Secondary | ICD-10-CM | POA: Diagnosis not present

## 2016-11-11 ENCOUNTER — Ambulatory Visit
Admission: RE | Admit: 2016-11-11 | Discharge: 2016-11-11 | Disposition: A | Discharge status: Home / Self Care | Payer: BLUE CROSS/BLUE SHIELD | Source: Ambulatory Visit | Attending: Obstetrics and Gynecology | Admitting: Obstetrics and Gynecology

## 2016-11-11 DIAGNOSIS — Z1231 Encounter for screening mammogram for malignant neoplasm of breast: Secondary | ICD-10-CM | POA: Diagnosis not present

## 2016-11-11 DIAGNOSIS — Z1239 Encounter for other screening for malignant neoplasm of breast: Secondary | ICD-10-CM

## 2016-11-27 IMAGING — DX DG LUMBAR SPINE 2-3V
2 series · 2 of 2 positions shown · non-contrast
Comparison: No recent prior.

CLINICAL DATA: Herniated disc.

EXAM:
LUMBAR SPINE - 2-3 VIEW

[l-spine ap]
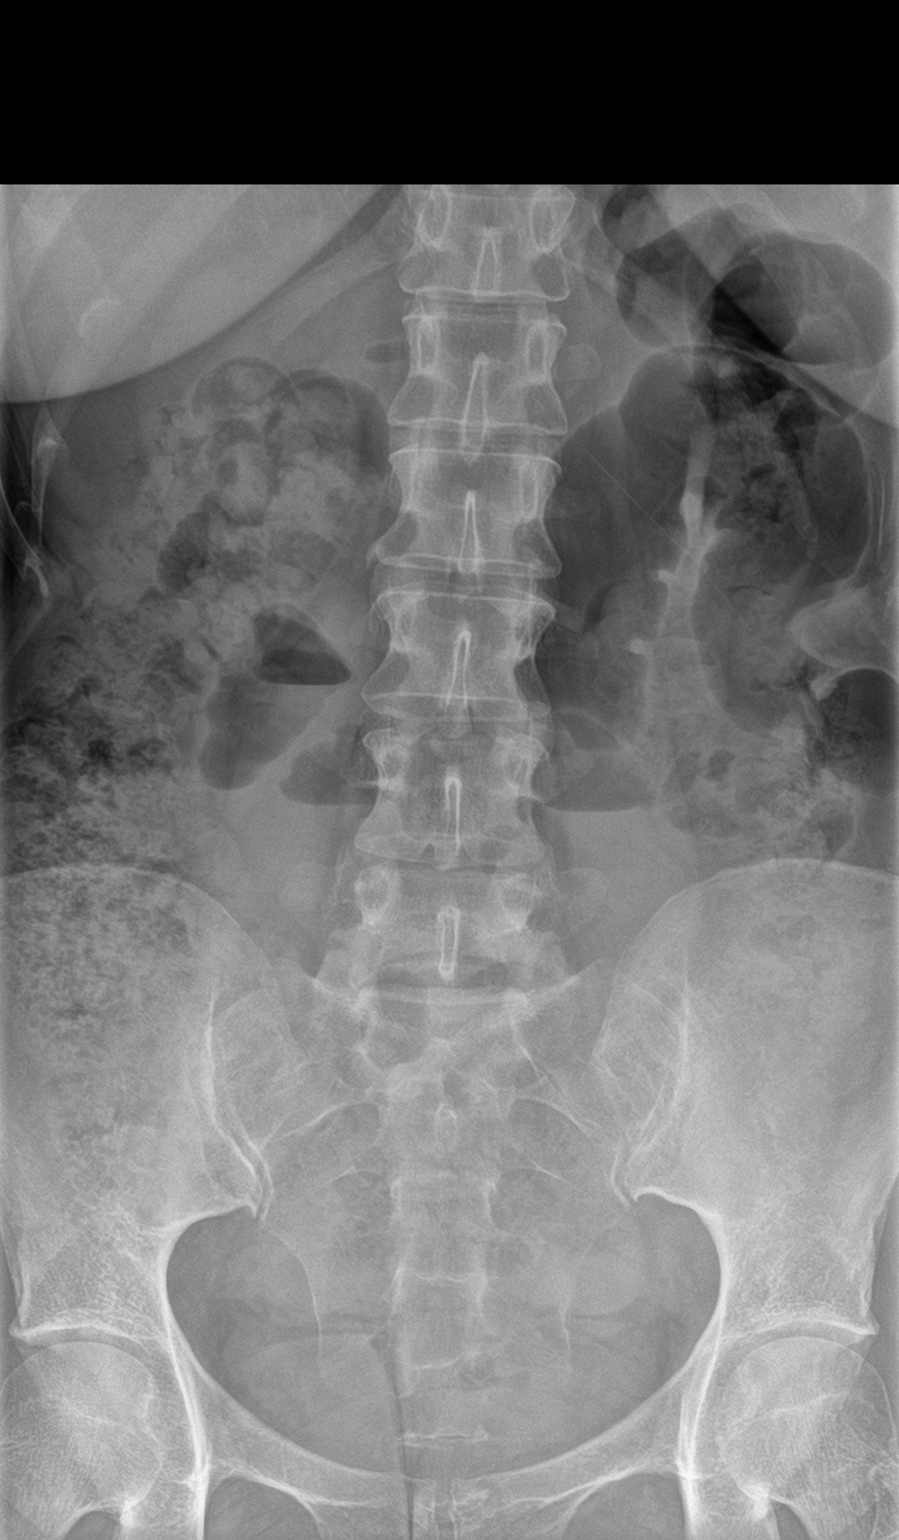

[l-spine lat]
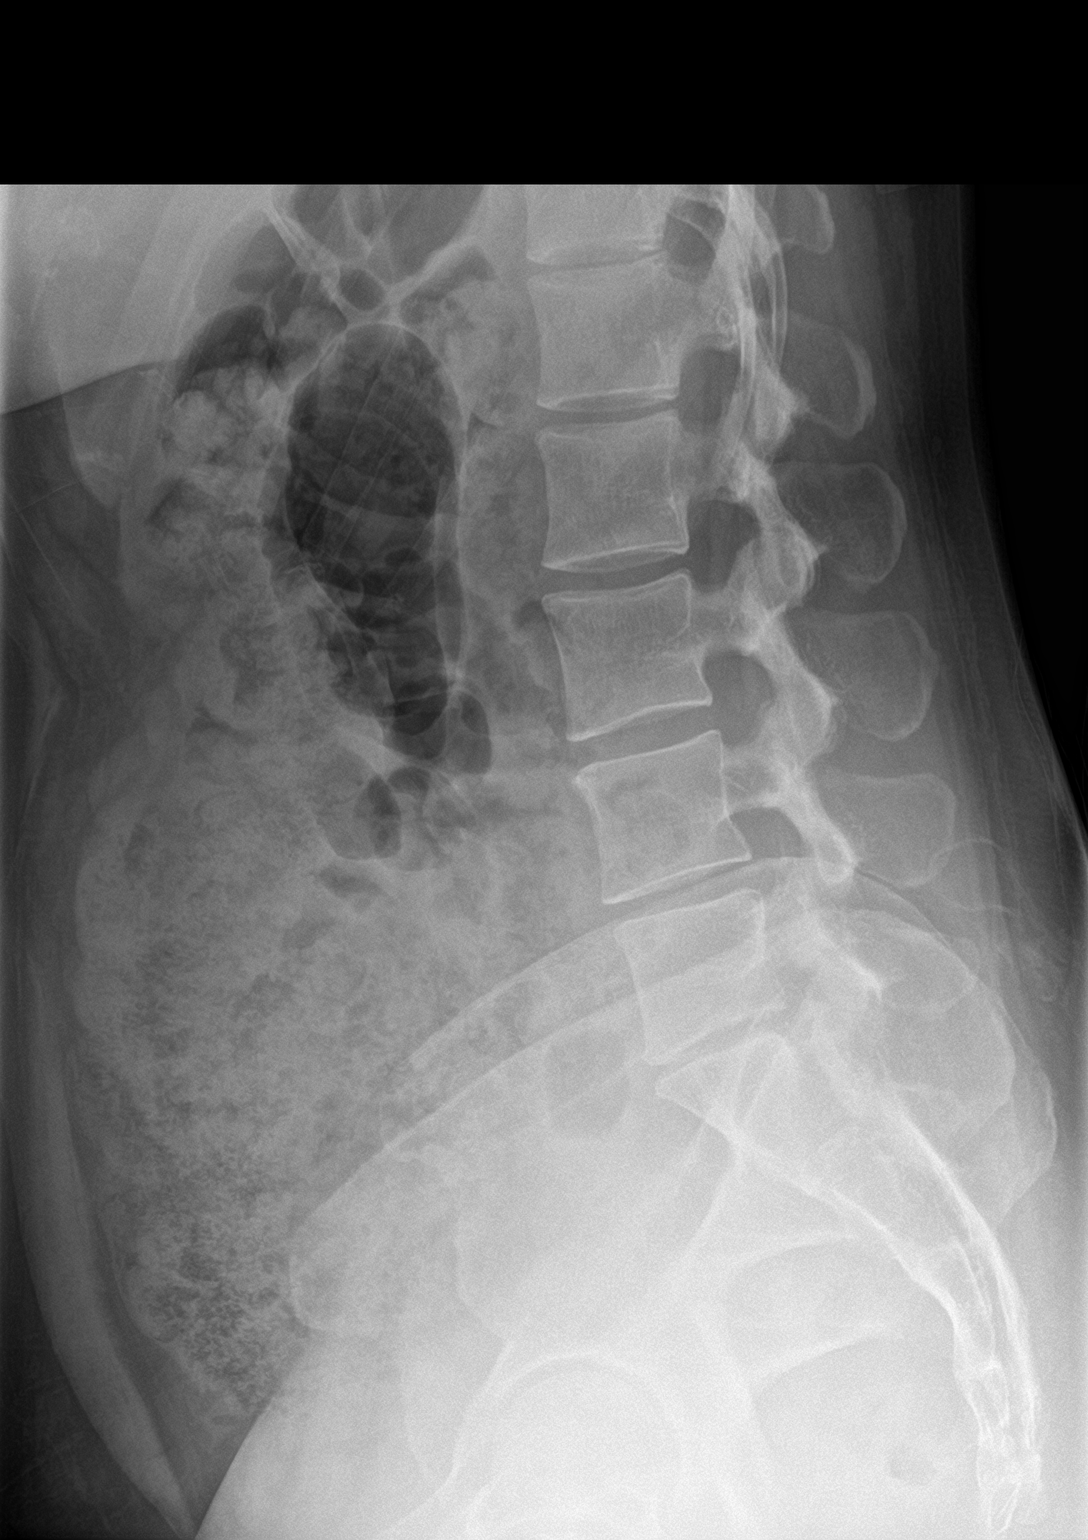

[2 of 2 positions shown; findings below may reference images not displayed]

FINDINGS: No acute bony abnormality identified. Diffuse degenerative change.
No evidence of fracture or dislocation. Several distended loops of
small bowel noted. Mild adynamic ileus and/or bowel obstruction
cannot be excluded. Stool noted throughout the colon. Constipation
cannot be excluded. Follow-up three-way of the abdomen should be
considered for further evaluation.
IMPRESSION: 1.  Diffuse degenerative change.  No acute abnormality.

2. Several loops of distended small bowel noted. Mild adynamic ileus
or developing bowel obstruction cannot be completely excluded. Large
amount stool noted throughout the colon. Constipation cannot be
excluded. Follow-up three-way of the abdomen suggested.

## 2017-02-14 DIAGNOSIS — Z01419 Encounter for gynecological examination (general) (routine) without abnormal findings: Secondary | ICD-10-CM | POA: Diagnosis not present

## 2017-02-14 DIAGNOSIS — Z6828 Body mass index (BMI) 28.0-28.9, adult: Secondary | ICD-10-CM | POA: Diagnosis not present

## 2017-02-23 DIAGNOSIS — N951 Menopausal and female climacteric states: Secondary | ICD-10-CM | POA: Diagnosis not present

## 2017-02-23 DIAGNOSIS — G4700 Insomnia, unspecified: Secondary | ICD-10-CM | POA: Diagnosis not present

## 2017-02-23 DIAGNOSIS — R5383 Other fatigue: Secondary | ICD-10-CM | POA: Diagnosis not present

## 2017-02-23 DIAGNOSIS — K5900 Constipation, unspecified: Secondary | ICD-10-CM | POA: Diagnosis not present

## 2017-03-10 DIAGNOSIS — N951 Menopausal and female climacteric states: Secondary | ICD-10-CM | POA: Diagnosis not present

## 2017-03-10 DIAGNOSIS — G4700 Insomnia, unspecified: Secondary | ICD-10-CM | POA: Diagnosis not present

## 2017-03-10 DIAGNOSIS — R5383 Other fatigue: Secondary | ICD-10-CM | POA: Diagnosis not present

## 2017-03-10 DIAGNOSIS — K5900 Constipation, unspecified: Secondary | ICD-10-CM | POA: Diagnosis not present

## 2017-03-21 ENCOUNTER — Encounter: Payer: Self-pay | Admitting: Family Medicine

## 2017-03-21 ENCOUNTER — Ambulatory Visit: Payer: BLUE CROSS/BLUE SHIELD | Admitting: Family Medicine

## 2017-03-21 VITALS — BP 112/60 | HR 95 | Temp 98.6°F | Resp 12 | Ht 64.5 in | Wt 168.5 lb

## 2017-03-21 DIAGNOSIS — M549 Dorsalgia, unspecified: Secondary | ICD-10-CM | POA: Diagnosis not present

## 2017-03-21 MED ORDER — METHOCARBAMOL 500 MG PO TABS: 500.0000 mg | ORAL_TABLET | Freq: Three times a day (TID) | ORAL | 0 refills | Status: AC | PRN

## 2017-03-21 MED ORDER — PREDNISONE 20 MG PO TABS: ORAL_TABLET | ORAL | 0 refills | Status: AC

## 2017-03-21 NOTE — Patient Instructions (Signed)
[name] Bowman I have seen you today for an acute visit.  A few things to remember from today's visit:   Back pain with radiation - Plan: predniSONE (DELTASONE) 20 MG tablet, methocarbamol (ROBAXIN) 500 MG tablet   Medications prescribed today are intended for short period of time and will not be refill upon request, a follow up appointment might be necessary to discuss continuation of of treatment if appropriate.    Back pain is very common in adults.The cause of back pain is rarely dangerous and the pain often gets better over time even with no pharmacologic treatment.  The cause of your back pain may not be known. Some common causes of back pain include: 1. Strain of the muscles or ligaments supporting the spine. 2. Wear and tear (degeneration) of the spinal disks. 3. Arthritis. 4. Direct injury to the back.  For many people, back pain may return. Since back pain is rarely dangerous, most people can learn to manage this condition on their own.  HOME CARE INSTRUCTIONS Watch your back pain for any changes. The following actions may help to lessen any discomfort you are feeling:  1. Remain active. It is stressful on your back to sit or stand in one place for long periods of time. Do not sit, drive, or stand in one place for more than 30 minutes at a time. Take short walks on even surfaces as soon as you are able.Try to increase the length of time you walk each day.  2. Exercise regularly as directed by your health care provider. Exercise helps your back heal faster. It also helps avoid future injury by keeping your muscles strong and flexible.  3. Do not stay in bed.Resting more than 1-2 days can delay your recovery.                                                      4. Pay attention to your body when you bend and lift. The most comfortable positions are those that put less stress on your recovering back.  5.  Always use proper lifting techniques,  including: Bending your knees. Keeping the load close to your body. Avoiding twisting.  6. Find a comfortable position to sleep. Use a firm mattress and lie on your side with your knees slightly bent. If you lie on your back, put a pillow under your knees.  7. Over the counter rubbing medications like Icy Hot or Asper cream with Lidocaine may help without significant side effects.  Acetaminophen and/or Aleve/Ibuprofen can be taken if needed and if not contraindications. Local ice and heat may be alternated to reduce pain and spasms. Also massage and even chiropractor treatment.      Muscle relaxants might or might not help, they cause drowsiness among other    side effects. They could also interact with some of medications you may be already taking (medications for depression/anxiety and some pain medications).   8. Maintain a healthy weight. Excess weight puts extra stress on your back and makes it difficult to maintain good posture.   SEEK MEDICAL CARE IF: worsening pain, associated fever, rash/edema on area, pain going to legs or buttocks, numbness/tingling, night pain, or abnormal weight loss.    SEEK IMMEDIATE MEDICAL CARE IF:  1. You develop new bowel or bladder control problems. 2. You have  unusual weakness or numbness in your arms or legs. 3. You develop nausea or vomiting. 4. You develop abdominal pain. 5. You feel faint.     Back Exercises The following exercises strengthen the muscles that help to support the back. They also help to keep the lower back flexible. Doing these exercises can help to prevent back pain or lessen existing pain. If you have back pain or discomfort, try doing these exercises 2-3 times each day or as told by your health care provider. When the pain goes away, do them once each day, but increase the number of times that you repeat the steps for each exercise (do more repetitions). If you do not have back pain or discomfort, do these exercises once each  day or as told by your health care provider.   EXERCISES Single Knee to Chest Repeat these steps 3-5 times for each leg: 5. Lie on your back on a firm bed or the floor with your legs extended. 6. Bring one knee to your chest. Your other leg should stay extended and in contact with the floor. 7. Hold your knee in place by grabbing your knee or thigh. 8. Pull on your knee until you feel a gentle stretch in your lower back. 9. Hold the stretch for 10-30 seconds. 10. Slowly release and straighten your leg.  Pelvic Tilt Repeat these steps 5-10 times: 2. Lie on your back on a firm bed or the floor with your legs extended. 3. Bend your knees so they are pointing toward the ceiling and your feet are flat on the floor. 4. Tighten your lower abdominal muscles to press your lower back against the floor. This motion will tilt your pelvis so your tailbone points up toward the ceiling instead of pointing to your feet or the floor. 5. With gentle tension and even breathing, hold this position for 5-10 seconds.  Cat-Cow Repeat these steps until your lower back becomes more flexible: 1. Get into a hands-and-knees position on a firm surface. Keep your hands under your shoulders, and keep your knees under your hips. You may place padding under your knees for comfort. 2. Let your head hang down, and point your tailbone toward the floor so your lower back becomes rounded like the back of a cat. 3. Hold this position for 5 seconds. 4. Slowly lift your head and point your tailbone up toward the ceiling so your back forms a sagging arch like the back of a cow. 5. Hold this position for 5 seconds.   Press-Ups Repeat these steps 5-10 times: 6. Lie on your abdomen (face-down) on the floor. 7. Place your palms near your head, about shoulder-width apart. 8. While you keep your back as relaxed as possible and keep your hips on the floor, slowly straighten your arms to raise the top half of your body and lift your  shoulders. Do not use your back muscles to raise your upper torso. You may adjust the placement of your hands to make yourself more comfortable. 9. Hold this position for 5 seconds while you keep your back relaxed. 10. Slowly return to lying flat on the floor.   Bridges Repeat these steps 10 times: 1. Lie on your back on a firm surface. 2. Bend your knees so they are pointing toward the ceiling and your feet are flat on the floor. 3. Tighten your buttocks muscles and lift your buttocks off of the floor until your waist is at almost the same height as your knees. You  should feel the muscles working in your buttocks and the back of your thighs. If you do not feel these muscles, slide your feet 1-2 inches farther away from your buttocks. 4. Hold this position for 3-5 seconds. 5. Slowly lower your hips to the starting position, and allow your buttocks muscles to relax completely. If this exercise is too easy, try doing it with your arms crossed over your chest.      In general please monitor for signs of worsening symptoms and seek immediate medical attention if any concerning.  If symptoms are not resolved in a few days/weeks you should schedule a follow up appointment with your doctor, before if needed.  I hope you get better soon!

## 2017-03-21 NOTE — Progress Notes (Signed)
ACUTE VISIT   HPI:  Chief Complaint  Patient presents with  . Back Pain    [name] Bowman is a 42 y.o. female, who is here today complaining of 2 weeks of intermittent lower back pain.  About 2 weeks ago she fell backwards,landed on her back while she was skiing. She was able to get up with some back discomfort,no limitations.  Pain aggravated by sitting for 4 hours drive. Pain is better today. In the past she has taken Prednisone , which has helped with similar pain is the past.  Pain is radiated to both hips, soreness like, Now it is 2-3/10 in intensity, with no associated LE numbness, tingling, urinary incontinence or retention, stool incontinence, or saddle anesthesia.  Exacerbated by prolonged sitting or standing. Alleviated by movement and Ibuprofen. No rash or edema on area, fever, chills, or abnormal wt loss.  Hx of lower back pain, s/p lumbar decompression.   OTC medications: Ibuprofen 400 mg last night.  She already has an appt with her ortho tomorrow. She needs form completed for work within 72 hours after missing work.   Review of Systems  Constitutional: Negative for activity change, appetite change, fatigue and fever.  HENT: Negative for mouth sores and sore throat.   Respiratory: Negative for shortness of breath.   Cardiovascular: Negative for leg swelling.  Gastrointestinal: Negative for abdominal pain, blood in stool, nausea and vomiting.       Negative for changes in bowel habits.  Genitourinary: Negative for decreased urine volume, dysuria and hematuria.  Musculoskeletal: Positive for back pain. Negative for gait problem.  Skin: Negative for rash.  Neurological: Negative for weakness and numbness.      Current Outpatient Medications on File Prior to Visit  Medication Sig Dispense Refill  . acyclovir (ZOVIRAX) 800 MG tablet Take 800 mg by mouth 2 (two) times daily.    Marland Kitchen BLACK COHOSH PO Take 800 mg by mouth daily.    . Chaste Tree  (VITEX EXTRACT PO) Take 1,000 mg by mouth.    . Cholecalciferol (VITAMIN D PO) Take 5,000 Units by mouth.    Marland Kitchen EC-RX TESTOSTERONE 0.4 % CREA Place onto the skin.    Marland Kitchen MAGNESIUM OXIDE 400 PO Take 400 mg by mouth.    . Multiple Vitamin (MULTIVITAMINS PO) Take by mouth.    . Omega-3 Fatty Acids (SUPER OMEGA 3 PO) Take by mouth.    . Probiotic Product (PROBIOTIC DAILY PO) Take 1 capsule by mouth daily.     . Progesterone Micronized (EC-RX PROGESTERONE) 20 % CREA Place onto the skin.    Marland Kitchen thyroid (ARMOUR) 32.5 MG tablet Take 32.5 mg by mouth daily.    Marland Kitchen zolpidem (AMBIEN) 10 MG tablet Take 1 tablet (10 mg total) by mouth at bedtime as needed for sleep. 90 tablet 1  . meloxicam (MOBIC) 15 MG tablet Take 1 tablet (15 mg total) by mouth daily. Take with food each morning (Patient not taking: Reported on 03/21/2017) 10 tablet 1   No current facility-administered medications on file prior to visit.      Past Medical History:  Diagnosis Date  . ANEMIA-NOS 03/29/2007  . Asymptomatic PVCs 01-21-11   worked up by Dr. Kenna Gilbert in 2009 with ECHO (benign), then saw Dr. Merrily Pew for exam (benign)  . CONSTIPATION 05/16/2008  . GERD 05/13/2008  . Headache(784.0) 05/12/2007  . History of exercise stress test 08-20-13   normal   . HYPERLIPIDEMIA 03/29/2007  . INSOMNIA  03/29/2007  . Patellar tendinitis 05/31/2008   Allergies  Allergen Reactions  . Sulfonamide Derivatives Rash    Social History   Socioeconomic History  . Marital status: Single    Spouse name: None  . Number of children: 0  . Years of education: None  . Highest education level: None  Social Needs  . Financial resource strain: None  . Food insecurity - worry: None  . Food insecurity - inability: None  . Transportation needs - medical: None  . Transportation needs - non-medical: None  Occupational History  . Occupation: Dentistflight attendant    Employer: [employer]  Tobacco Use  . Smoking status: Never Smoker  . Smokeless tobacco:  Never Used  Substance and Sexual Activity  . Alcohol use: Yes    Alcohol/week: 0.6 oz    Types: 1 Standard drinks or equivalent per week    Comment: 1-2 glassses of wine or beer per week   . Drug use: No  . Sexual activity: Yes  Other Topics Concern  . None  Social History Narrative  . None    Vitals:   03/21/17 1532  BP: 112/60  Pulse: 95  Resp: 12  Temp: 98.6 F (37 C)  SpO2: 100%   Body mass index is 28.48 kg/m.    Physical Exam  Nursing note and vitals reviewed. Constitutional: She is oriented to person, place, and time. She appears well-developed. She does not appear ill. No distress.  HENT:  Head: Normocephalic and atraumatic.  Eyes: Conjunctivae are normal.  Respiratory: Effort normal and breath sounds normal. No respiratory distress.  GI: Soft. She exhibits no mass. There is no tenderness.  Musculoskeletal: She exhibits no edema.       Thoracic back: She exhibits no tenderness and no bony tenderness.       Lumbar back: She exhibits no tenderness and no bony tenderness.  No significant deformity appreciated. No tenderness upon palpation of paraspinal muscles. Pain is not elicited with movement on exam table during examination. No local edema or erythema appreciated, no suspicious lesions.    Neurological: She is alert and oriented to person, place, and time. She has normal strength. Gait normal.  Reflex Scores:      Patellar reflexes are 2+ on the right side and 2+ on the left side. SLR negative bilateral. She can walk on heels and tip toes with no difficulty.  Skin: Skin is warm. No rash noted. No erythema.  Psychiatric: She has a normal mood and affect.  Well groomed, good eye contact.      ASSESSMENT AND PLAN:   [name] Bowman was seen today for back pain.  Diagnoses and all orders for this visit:  Back pain with radiation   Side effects of Prednisone and Methocarbamol disease. She is keeping appt with her ortho tomorrow. Instructed about  warning signs. She feels like she can go back to work 03/23/17, which is her next work scheduled day (flight attendant). Form completed.   -     predniSONE (DELTASONE) 20 MG tablet; 3 tabs for 3 days, 2 tabs for 3 days, 1 tabs for 3 days, and 1/2 tab for 3 days. Take tables together with breakfast. -     methocarbamol (ROBAXIN) 500 MG tablet; Take 1 tablet (500 mg total) by mouth every 8 (eight) hours as needed for up to 15 days for muscle spasms.      -SarahDorise BullionKimberly G Bowman was advised to seek immediate medical attention if sudden worsening symptoms.  Earlyne Feeser G. [place], MD  [facility]. [place] office.

## 2017-03-22 DIAGNOSIS — M5136 Other intervertebral disc degeneration, lumbar region: Secondary | ICD-10-CM | POA: Diagnosis not present

## 2017-03-22 DIAGNOSIS — M545 Low back pain: Secondary | ICD-10-CM | POA: Diagnosis not present

## 2017-05-11 ENCOUNTER — Encounter: Payer: BLUE CROSS/BLUE SHIELD | Admitting: Family Medicine

## 2017-05-16 ENCOUNTER — Ambulatory Visit (INDEPENDENT_AMBULATORY_CARE_PROVIDER_SITE_OTHER): Payer: BLUE CROSS/BLUE SHIELD | Admitting: Family Medicine

## 2017-05-16 ENCOUNTER — Encounter: Payer: Self-pay | Admitting: Family Medicine

## 2017-05-16 VITALS — BP 110/70 | HR 89 | Temp 98.3°F | Ht 64.5 in | Wt 166.4 lb

## 2017-05-16 DIAGNOSIS — Z0000 Encounter for general adult medical examination without abnormal findings: Secondary | ICD-10-CM | POA: Diagnosis not present

## 2017-05-16 DIAGNOSIS — E669 Obesity, unspecified: Secondary | ICD-10-CM | POA: Diagnosis not present

## 2017-05-16 DIAGNOSIS — E782 Mixed hyperlipidemia: Secondary | ICD-10-CM | POA: Diagnosis not present

## 2017-05-16 LAB — POC URINALSYSI DIPSTICK (AUTOMATED)
BILIRUBIN UA: NEGATIVE
Blood, UA: NEGATIVE
Glucose, UA: NEGATIVE
Leukocytes, UA: NEGATIVE
NITRITE UA: NEGATIVE
Protein, UA: NEGATIVE
Spec Grav, UA: 1.025 (ref 1.010–1.025)
UROBILINOGEN UA: 0.2 U/dL
pH, UA: 6 (ref 5.0–8.0)

## 2017-05-16 MED ORDER — ZOLPIDEM TARTRATE 10 MG PO TABS: 10.0000 mg | ORAL_TABLET | Freq: Every evening | ORAL | 1 refills | Status: DC | PRN

## 2017-05-16 NOTE — Progress Notes (Signed)
   Subjective:    Patient ID: [name] Bowman, female    DOB: 10/25/1975, 42 y.o.   MRN: [id number]  HPI Here for a well exam. She feels well. She has been seeing Dr. Jeanann Lewandowsky at the [facility] center in [place], and she brings with her a copy of recent labs they had done. These were significant for normal thyroid studies. [name] Bowman is interested in seeing a Nutritionist to work on lowering her cholesterol and on losing weight.   Review of Systems  Constitutional: Negative.   HENT: Negative.   Eyes: Negative.   Respiratory: Negative.   Cardiovascular: Negative.   Gastrointestinal: Negative.   Genitourinary: Negative for decreased urine volume, difficulty urinating, dyspareunia, dysuria, enuresis, flank pain, frequency, hematuria, pelvic pain and urgency.  Musculoskeletal: Negative.   Skin: Negative.   Neurological: Negative.   Psychiatric/Behavioral: Negative.        Objective:   Physical Exam  Constitutional: She is oriented to person, place, and time. She appears well-developed and well-nourished. No distress.  HENT:  Head: Normocephalic and atraumatic.  Right Ear: External ear normal.  Left Ear: External ear normal.  Nose: Nose normal.  Mouth/Throat: Oropharynx is clear and moist. No oropharyngeal exudate.  Eyes: Pupils are equal, round, and reactive to light. Conjunctivae and EOM are normal. No scleral icterus.  Neck: Normal range of motion. Neck supple. No JVD present. No thyromegaly present.  Cardiovascular: Normal rate, regular rhythm, normal heart sounds and intact distal pulses. Exam reveals no gallop and no friction rub.  No murmur heard. Pulmonary/Chest: Effort normal and breath sounds normal. No respiratory distress. She has no wheezes. She has no rales. She exhibits no tenderness.  Abdominal: Soft. Bowel sounds are normal. She exhibits no distension and no mass. There is no tenderness. There is no rebound and no guarding.    Musculoskeletal: Normal range of motion. She exhibits no edema or tenderness.  Lymphadenopathy:    She has no cervical adenopathy.  Neurological: She is alert and oriented to person, place, and time. She has normal reflexes. She displays normal reflexes. No cranial nerve deficit. She exhibits normal muscle tone. Coordination normal.  Skin: Skin is warm and dry. No rash noted. No erythema.  Psychiatric: She has a normal mood and affect. Her behavior is normal. Judgment and thought content normal.          Assessment & Plan:  Well exam. We dicussed diet and exercise. Get fasting labs. Refer to Nutrition.  Gershon Crane, MD

## 2017-05-17 LAB — BASIC METABOLIC PANEL
BUN: 18 mg/dL (ref 6–23)
CHLORIDE: 100 meq/L (ref 96–112)
CO2: 27 mEq/L (ref 19–32)
CREATININE: 0.96 mg/dL (ref 0.40–1.20)
Calcium: 9.5 mg/dL (ref 8.4–10.5)
GFR: 67.16 mL/min (ref >60.00)
GLUCOSE: 84 mg/dL (ref 70–99)
POTASSIUM: 3.9 meq/L (ref 3.5–5.1)
Sodium: 137 mEq/L (ref 135–145)

## 2017-05-17 LAB — LIPID PANEL
CHOL/HDL RATIO: 4
CHOLESTEROL: 247 mg/dL — AB (ref 0–200)
HDL: 67.6 mg/dL (ref >39.00)
LDL CALC: 166 mg/dL — AB (ref 0–99)
NonHDL: 179.07
Triglycerides: 67 mg/dL (ref 0.0–149.0)
VLDL: 13.4 mg/dL (ref 0.0–40.0)

## 2017-05-17 LAB — CBC WITH DIFFERENTIAL/PLATELET
Basophils Absolute: 0 10*3/uL (ref 0.0–0.1)
Basophils Relative: 0.3 % (ref 0.0–3.0)
EOS ABS: 0.1 10*3/uL (ref 0.0–0.7)
Eosinophils Relative: 1.6 % (ref 0.0–5.0)
HCT: 42.6 % (ref 36.0–46.0)
HEMOGLOBIN: 14.9 g/dL (ref 12.0–15.0)
LYMPHS PCT: 43.9 % (ref 12.0–46.0)
Lymphs Abs: 3.1 10*3/uL (ref 0.7–4.0)
MCHC: 34.9 g/dL (ref 30.0–36.0)
MCV: 93.4 fl (ref 78.0–100.0)
MONO ABS: 0.4 10*3/uL (ref 0.1–1.0)
Monocytes Relative: 5.9 % (ref 3.0–12.0)
Neutro Abs: 3.5 10*3/uL (ref 1.4–7.7)
Neutrophils Relative %: 48.3 % (ref 43.0–77.0)
PLATELETS: 407 10*3/uL — AB (ref 150.0–400.0)
RBC: 4.56 Mil/uL (ref 3.87–5.11)
RDW: 12.5 % (ref 11.5–15.5)
WBC: 7.2 10*3/uL (ref 4.0–10.5)

## 2017-05-17 LAB — HEPATIC FUNCTION PANEL
ALT: 12 U/L (ref 0–35)
AST: 14 U/L (ref 0–37)
Albumin: 4.5 g/dL (ref 3.5–5.2)
Alkaline Phosphatase: 42 U/L (ref 39–117)
Bilirubin, Direct: 0.1 mg/dL (ref 0.0–0.3)
Total Bilirubin: 0.6 mg/dL (ref 0.2–1.2)
Total Protein: 6.9 g/dL (ref 6.0–8.3)

## 2017-05-19 ENCOUNTER — Telehealth: Payer: Self-pay | Admitting: Family Medicine

## 2017-05-19 NOTE — Telephone Encounter (Signed)
See result notes. 

## 2017-05-19 NOTE — Telephone Encounter (Signed)
Copied from CRM [id number]. Topic: Quick Communication - Lab Results >> May 18, 2017  9:32 AM Gracelyn Nurse, [place] wrote: Called patient to inform them of  lab results. When patient returns call, triage nurse may disclose results.  Pt calling about lab results she is going to go to sleep in about an hour, has to work later [employer]. Ok to leave detailed message on VM CB# [phone]

## 2017-05-30 ENCOUNTER — Encounter: Payer: Self-pay | Admitting: *Deleted

## 2017-06-01 MED ORDER — ATORVASTATIN CALCIUM 20 MG PO TABS: 20.0000 mg | ORAL_TABLET | Freq: Every day | ORAL | 3 refills | Status: DC

## 2017-06-01 NOTE — Telephone Encounter (Signed)
Patient returned message regarding lab encounter note recorded by Dr Clent Ridges on May 17 2017  Result notes given to patient  Pharmacy  Of choice is [facility] on [address]

## 2017-06-01 NOTE — Telephone Encounter (Signed)
Lipitor 20 mg daily. Call in #90 with 3 rf. Recheck labs in 90 days  rx has been sent

## 2017-06-01 NOTE — Addendum Note (Signed)
Addended by: Gracelyn Nurse on: 06/01/2017 05:49 PM   Modules accepted: Orders

## 2017-06-20 ENCOUNTER — Encounter: Payer: BLUE CROSS/BLUE SHIELD | Attending: Family Medicine | Admitting: Skilled Nursing Facility1

## 2017-06-20 DIAGNOSIS — E7800 Pure hypercholesterolemia, unspecified: Secondary | ICD-10-CM

## 2017-06-20 DIAGNOSIS — E782 Mixed hyperlipidemia: Secondary | ICD-10-CM | POA: Diagnosis not present

## 2017-06-20 DIAGNOSIS — Z713 Dietary counseling and surveillance: Secondary | ICD-10-CM | POA: Diagnosis not present

## 2017-06-20 DIAGNOSIS — E669 Obesity, unspecified: Secondary | ICD-10-CM | POA: Insufficient documentation

## 2017-06-20 NOTE — Patient Instructions (Addendum)
-  Aim for at least 6 hours of sleep every night   -30 minutes on the treadmill after you wake up on days you work   -When not working: treadmill for 30-45 minutes   -Aim for a minimum of 3 days a week    -Aim to eat every 3-5 hours with balance   -Carbs are not the enemy! Love them!!

## 2017-06-20 NOTE — Progress Notes (Signed)
  Assessment:  Primary concerns today: hyperlipidemia.   Pt is taking vitec berry, vtimain d, probiotic, oil of oregano, pregnendone, nature thrio, black cohash, super omega 3, magnesium malate. 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 Pt states she was told she needs cholesterol medication but refuses. Pt states he is 30-40 pounds over what she wants to be but her current weight has been her usual weight for about 20 years. Pt states her father does have diabetes and high cholesterol. Pt states low carb diets work best for her. Pt states she has to take sleep medication on days she works. Pt states she works 3-6 days a week working days and nights back and forth. Pt states she is afraid of carbohydrates.    MEDICATIONS: See List   DIETARY INTAKE:  Usual eating pattern includes 3 meals and 2 snacks per day.  Everyday foods include none stated.  Avoided foods include none stated.    24-hr recall: out for most meals B ( AM): coffee with half and half with egg bites from [employer]  Snk ( AM):   L ( PM): salad with meat Snk ( PM): maricheno cherries and cheese D ( PM): out to dinner or smoothie or yogurt  Snk ( PM):  Beverages: coffee, unsweet tea, sparkling water, beer or wine   Usual physical activity: ADl's  Estimated energy needs: 1500 calories 170 g carbohydrates 112 g protein 42 g fat  Progress Towards Goal(s):  In progress.    Intervention:  Nutrition counseling. Dietitian educated the pt on a lifestyle conducing to healthy cholesterol numbers. -Aim for at least 6 hours of sleep every night -30 minutes on the treadmill after you wake up on days you work -When not working: treadmill for 30-45 minutes -Aim for a minimum of 3 days a week  -Aim to eat every 3-5 hours with balance  -Carbs are not the enemy! Love them!! Teaching Method Utilized:  Visual Auditory Hands on  Handouts given during visit include:  Meal ideas  Barriers to learning/adherence to lifestyle change:  diet mentality   Demonstrated degree of understanding via:  Teach Back   Monitoring/Evaluation:  Dietary intake, exercise, and body weight prn.

## 2017-06-29 DIAGNOSIS — K5900 Constipation, unspecified: Secondary | ICD-10-CM | POA: Diagnosis not present

## 2017-06-29 DIAGNOSIS — B2790 Infectious mononucleosis, unspecified without complication: Secondary | ICD-10-CM | POA: Diagnosis not present

## 2017-06-29 DIAGNOSIS — G4700 Insomnia, unspecified: Secondary | ICD-10-CM | POA: Diagnosis not present

## 2017-06-29 DIAGNOSIS — E039 Hypothyroidism, unspecified: Secondary | ICD-10-CM | POA: Diagnosis not present

## 2017-06-29 DIAGNOSIS — R5383 Other fatigue: Secondary | ICD-10-CM | POA: Diagnosis not present

## 2017-07-01 DIAGNOSIS — H11132 Conjunctival pigmentations, left eye: Secondary | ICD-10-CM | POA: Diagnosis not present

## 2017-07-22 ENCOUNTER — Encounter: Payer: Self-pay | Admitting: Physician Assistant

## 2017-07-22 ENCOUNTER — Ambulatory Visit: Payer: BLUE CROSS/BLUE SHIELD | Admitting: Physician Assistant

## 2017-07-22 VITALS — BP 120/74 | HR 81 | Temp 98.6°F | Ht 64.5 in | Wt 170.4 lb

## 2017-07-22 DIAGNOSIS — M545 Low back pain, unspecified: Secondary | ICD-10-CM

## 2017-07-22 MED ORDER — METHOCARBAMOL 500 MG PO TABS: 500.0000 mg | ORAL_TABLET | Freq: Every evening | ORAL | 0 refills | Status: DC | PRN

## 2017-07-22 NOTE — Patient Instructions (Signed)
It was great to see you!  If any changes in your back pain, please return to PCP for further evaluation and treatment.

## 2017-07-22 NOTE — Progress Notes (Signed)
[name] Bowman is a 42 y.o. female here for a follow up of a pre-existing problem.  I acted as a Neurosurgeon for [employer], PA-C Corky Mull, LPN  History of Present Illness:   Chief Complaint  Patient presents with  . Back Pain    Back Pain  This is a chronic problem. The current episode started today (Pt is having a flare again started this morning. Pt was working in the yard yesterday. ). The problem occurs constantly. The problem is unchanged. The pain is present in the lumbar spine. The quality of the pain is described as aching. The pain does not radiate. The pain is at a severity of 5/10. The pain is moderate. The pain is the same all the time. The symptoms are aggravated by bending, position and twisting. Stiffness is present in the morning. Pertinent negatives include no abdominal pain, fever, headaches, leg pain, numbness, tingling or weakness. Risk factors: Pt has history ruptured disk. Treatments tried: Stretches and rest. The treatment provided mild relief.   She has been treated in the past for this with prednisone and muscle relaxers. She states that she is currently on medication to treat her peri-menopausal symptoms and the prednisone makes have worsening hot flashes. Denies any urinary issues or issues with bowel incontinence.   Past Medical History:  Diagnosis Date  . ANEMIA-NOS 03/29/2007  . Asymptomatic PVCs 01-21-11   worked up by Dr. Kenna Gilbert in 2009 with ECHO (benign), then saw Dr. Merrily Pew for exam (benign)  . CONSTIPATION 05/16/2008  . GERD 05/13/2008  . Headache(784.0) 05/12/2007  . History of exercise stress test 08-20-13   normal   . HYPERLIPIDEMIA 03/29/2007  . INSOMNIA 03/29/2007  . Patellar tendinitis 05/31/2008     Social History   Socioeconomic History  . Marital status: Single    Spouse name: Not on file  . Number of children: 0  . Years of education: Not on file  . Highest education level: Not on file  Occupational History  . Occupation:  Dentist: Sport and exercise psychologist  Social Needs  . Financial resource strain: Not on file  . Food insecurity:    Worry: Not on file    Inability: Not on file  . Transportation needs:    Medical: Not on file    Non-medical: Not on file  Tobacco Use  . Smoking status: Never Smoker  . Smokeless tobacco: Never Used  Substance and Sexual Activity  . Alcohol use: Yes    Alcohol/week: 0.6 oz    Types: 1 Standard drinks or equivalent per week    Comment: 1-2 glassses of wine or beer per week   . Drug use: No  . Sexual activity: Yes  Lifestyle  . Physical activity:    Days per week: Not on file    Minutes per session: Not on file  . Stress: Not on file  Relationships  . Social connections:    Talks on phone: Not on file    Gets together: Not on file    Attends religious service: Not on file    Active member of club or organization: Not on file    Attends meetings of clubs or organizations: Not on file    Relationship status: Not on file  . Intimate partner violence:    Fear of current or ex partner: Not on file    Emotionally abused: Not on file    Physically abused: Not on file    Forced sexual  activity: Not on file  Other Topics Concern  . Not on file  Social History Narrative  . Not on file    Past Surgical History:  Procedure Laterality Date  . HAMMER TOE SURGERY  2012   right 4th and 5th toes  . LUMBAR LAMINECTOMY/DECOMPRESSION MICRODISCECTOMY Right 08/01/2015   Procedure: MICRO LUMBAR DECOMPRESSION L5-S1 RIGHT (1 LEVEL);  Surgeon: Jene EveryJeffrey Beane, MD;  Location: [facility];  Service: Orthopedics;  Laterality: Right;  . mole removed from tear duct as a child       Family History  Problem Relation Age of Onset  . Breast cancer Mother   . Diabetes Father   . Heart disease Father 2553  . Breast cancer Maternal Grandmother   . Breast cancer Maternal Aunt   . Ovarian cancer Maternal Aunt   . Colon cancer Neg Hx     Allergies  Allergen Reactions  .  Sulfonamide Derivatives Rash    Current Medications:   Current Outpatient Medications:  .  BLACK COHOSH PO, Take 400 mg by mouth daily. , Disp: , Rfl:  .  Chaste Tree (VITEX EXTRACT PO), Take 1,000 mg by mouth. , Disp: , Rfl:  .  Cholecalciferol (VITAMIN D PO), Take 5,000 Units by mouth., Disp: , Rfl:  .  EC-RX TESTOSTERONE 0.4 % CREA, Place onto the skin., Disp: , Rfl:  .  MAGNESIUM OXIDE 400 PO, Take 400 mg by mouth., Disp: , Rfl:  .  medium chain triglycerides (MCT OIL) oil, Take by mouth daily., Disp: , Rfl:  .  Omega-3 Fatty Acids (SUPER OMEGA 3 PO), Take by mouth., Disp: , Rfl:  .  Pregnenolone POWD, by Does not apply route., Disp: , Rfl:  .  Probiotic Product (PROBIOTIC DAILY PO), Take 1 capsule by mouth daily. , Disp: , Rfl:  .  Progesterone Micronized (EC-RX PROGESTERONE) 20 % CREA, Place onto the skin., Disp: , Rfl:  .  thyroid (ARMOUR) 32.5 MG tablet, Take 16.25 mg by mouth daily., Disp: , Rfl:  .  zolpidem (AMBIEN) 10 MG tablet, Take 1 tablet (10 mg total) by mouth at bedtime as needed for sleep., Disp: 90 tablet, Rfl: 1 .  methocarbamol (ROBAXIN) 500 MG tablet, Take 1 tablet (500 mg total) by mouth at bedtime as needed for muscle spasms., Disp: 20 tablet, Rfl: 0   Review of Systems:   Review of Systems  Constitutional: Negative for fever.  Gastrointestinal: Negative for abdominal pain.  Musculoskeletal: Positive for back pain.  Neurological: Negative for tingling, weakness, numbness and headaches.    Vitals:   Vitals:   07/22/17 1325  BP: 120/74  Pulse: 81  Temp: 98.6 F (37 C)  TempSrc: Oral  SpO2: 98%  Weight: 170 lb 6.1 oz (77.3 kg)  Height: 5' 4.5" (1.638 m)     Body mass index is 28.79 kg/m.  Physical Exam:   Physical Exam  Constitutional: She appears well-developed. She is cooperative.  Non-toxic appearance. She does not have a sickly appearance. She does not appear ill. No distress.  Cardiovascular: Normal rate, regular rhythm, S1 normal, S2  normal, normal heart sounds and normal pulses.  No LE edema  Pulmonary/Chest: Effort normal and breath sounds normal.  Musculoskeletal:  No decreased ROM 2/2 pain with flexion/extension, lateral side bends, or rotation. Reproducible tenderness with deep palpation to bilateral lumbar paraspinal muscles. No bony tenderness. No evidence of erythema, rash or ecchymosis. Negative STLR bilaterally.   Neurological: She is alert. GCS eye subscore is 4. GCS verbal  subscore is 5. GCS motor subscore is 6.  Skin: Skin is warm, dry and intact.  Psychiatric: She has a normal mood and affect. Her speech is normal and behavior is normal.  Nursing note and vitals reviewed.   Assessment and Plan:    Meyli was seen today for back pain.  Diagnoses and all orders for this visit:  Acute bilateral low back pain without sciatica  Other orders -     methocarbamol (ROBAXIN) 500 MG tablet; Take 1 tablet (500 mg total) by mouth at bedtime as needed for muscle spasms.   No red flags on exam. Responding well to anti-inflammatories. Will provide prescription for muscle relaxer if needed. I have also completed work note for patient Follow-up if symptoms worsen or persist despite treatment.  . Reviewed expectations re: course of current medical issues. . Discussed self-management of symptoms. . Outlined signs and symptoms indicating need for more acute intervention. . Patient verbalized understanding and all questions were answered. . See orders for this visit as documented in the electronic medical record. . Patient received an After-Visit Summary.  CMA or LPN served as scribe during this visit. History, Physical, and Plan performed by medical provider. Documentation and orders reviewed and attested to.   Jarold Motto, PA-C

## 2017-08-22 ENCOUNTER — Other Ambulatory Visit: Payer: BLUE CROSS/BLUE SHIELD

## 2017-08-30 DIAGNOSIS — J3489 Other specified disorders of nose and nasal sinuses: Secondary | ICD-10-CM | POA: Diagnosis not present

## 2017-08-30 DIAGNOSIS — Z7289 Other problems related to lifestyle: Secondary | ICD-10-CM | POA: Diagnosis not present

## 2017-09-07 DIAGNOSIS — L821 Other seborrheic keratosis: Secondary | ICD-10-CM | POA: Diagnosis not present

## 2017-09-07 DIAGNOSIS — D1801 Hemangioma of skin and subcutaneous tissue: Secondary | ICD-10-CM | POA: Diagnosis not present

## 2017-09-07 DIAGNOSIS — D225 Melanocytic nevi of trunk: Secondary | ICD-10-CM | POA: Diagnosis not present

## 2017-11-21 ENCOUNTER — Telehealth: Payer: BLUE CROSS/BLUE SHIELD | Admitting: Family

## 2017-11-21 DIAGNOSIS — R110 Nausea: Secondary | ICD-10-CM

## 2017-11-21 MED ORDER — PROMETHAZINE HCL 25 MG PO TABS: 25.0000 mg | ORAL_TABLET | Freq: Three times a day (TID) | ORAL | 0 refills | Status: DC | PRN

## 2017-11-21 NOTE — Progress Notes (Signed)
We are sorry that you are not feeling well. Here is how we plan to help!  Based on what you have shared with me it looks like you have a Virus that is irritating your GI tract.  Vomiting is the forceful emptying of a portion of the stomach's content through the mouth.  Although nausea and vomiting can make you feel miserable, it's important to remember that these are not diseases, but rather symptoms of an underlying illness.  When we treat short term symptoms, we always caution that any symptoms that persist should be fully evaluated in a medical office.  I have prescribed a medication that will help alleviate your symptoms and allow you to stay hydrated:  Promethazine 25 mg take 1 tablet twice daily  HOME CARE:  Drink clear liquids.  This is very important! Dehydration (the lack of fluid) can lead to a serious complication.  Start off with 1 tablespoon every 5 minutes for 8 hours.  You may begin eating bland foods after 8 hours without vomiting.  Start with saltine crackers, white bread, rice, mashed potatoes, applesauce.  After 48 hours on a bland diet, you may resume a normal diet.  Try to go to sleep.  Sleep often empties the stomach and relieves the need to vomit.  GET HELP RIGHT AWAY IF:   Your symptoms do not improve or worsen within 2 days after treatment.  You have a fever for over 3 days.  You cannot keep down fluids after trying the medication.  MAKE SURE YOU:   Understand these instructions.  Will watch your condition.  Will get help right away if you are not doing well or get worse.   Thank you for choosing an e-visit. Your e-visit answers were reviewed by a board certified advanced clinical practitioner to complete your personal care plan. Depending upon the condition, your plan could have included both over the counter or prescription medications. Please review your pharmacy choice. Be sure that the pharmacy you have chosen is open so that you can pick up your  prescription now.  If there is a problem you may message your provider in MyChart to have the prescription routed to another pharmacy. Your safety is important to us. If you have drug allergies check your prescription carefully.  For the next 24 hours, you can use MyChart to ask questions about today's visit, request a non-urgent call back, or ask for a work or school excuse from your e-visit provider. You will get an e-mail in the next two days asking about your experience. I hope that your e-visit has been valuable and will speed your recovery.   

## 2017-12-10 ENCOUNTER — Telehealth: Payer: BLUE CROSS/BLUE SHIELD | Admitting: Family

## 2017-12-10 DIAGNOSIS — J019 Acute sinusitis, unspecified: Secondary | ICD-10-CM

## 2017-12-10 DIAGNOSIS — B9689 Other specified bacterial agents as the cause of diseases classified elsewhere: Secondary | ICD-10-CM

## 2017-12-10 MED ORDER — AMOXICILLIN-POT CLAVULANATE 875-125 MG PO TABS: 1.0000 | ORAL_TABLET | Freq: Two times a day (BID) | ORAL | 0 refills | Status: DC

## 2017-12-10 NOTE — Progress Notes (Signed)

## 2017-12-21 ENCOUNTER — Encounter: Payer: Self-pay | Admitting: Family Medicine

## 2017-12-21 NOTE — Telephone Encounter (Signed)
Dr. Fry please advise on refill of the ambien.  Thanks  

## 2017-12-22 MED ORDER — ZOLPIDEM TARTRATE 10 MG PO TABS: 10.0000 mg | ORAL_TABLET | Freq: Every evening | ORAL | 1 refills | Status: DC | PRN

## 2017-12-22 NOTE — Telephone Encounter (Signed)
Call in Zolpidem #90 with 1 rf

## 2018-01-23 DIAGNOSIS — G4700 Insomnia, unspecified: Secondary | ICD-10-CM | POA: Diagnosis not present

## 2018-01-23 DIAGNOSIS — R5383 Other fatigue: Secondary | ICD-10-CM | POA: Diagnosis not present

## 2018-01-23 DIAGNOSIS — E039 Hypothyroidism, unspecified: Secondary | ICD-10-CM | POA: Diagnosis not present

## 2018-01-23 DIAGNOSIS — N951 Menopausal and female climacteric states: Secondary | ICD-10-CM | POA: Diagnosis not present

## 2018-03-10 DIAGNOSIS — N644 Mastodynia: Secondary | ICD-10-CM | POA: Diagnosis not present

## 2018-03-31 ENCOUNTER — Telehealth: Payer: BLUE CROSS/BLUE SHIELD | Admitting: Family

## 2018-03-31 DIAGNOSIS — B9689 Other specified bacterial agents as the cause of diseases classified elsewhere: Secondary | ICD-10-CM | POA: Diagnosis not present

## 2018-03-31 DIAGNOSIS — J019 Acute sinusitis, unspecified: Secondary | ICD-10-CM | POA: Diagnosis not present

## 2018-03-31 MED ORDER — AMOXICILLIN-POT CLAVULANATE 875-125 MG PO TABS: 1.0000 | ORAL_TABLET | Freq: Two times a day (BID) | ORAL | 0 refills | Status: DC

## 2018-03-31 NOTE — Progress Notes (Signed)

## 2018-05-05 DIAGNOSIS — Z1231 Encounter for screening mammogram for malignant neoplasm of breast: Secondary | ICD-10-CM | POA: Diagnosis not present

## 2018-05-09 DIAGNOSIS — N951 Menopausal and female climacteric states: Secondary | ICD-10-CM | POA: Diagnosis not present

## 2018-05-09 DIAGNOSIS — R5383 Other fatigue: Secondary | ICD-10-CM | POA: Diagnosis not present

## 2018-05-09 DIAGNOSIS — E039 Hypothyroidism, unspecified: Secondary | ICD-10-CM | POA: Diagnosis not present

## 2018-06-08 ENCOUNTER — Other Ambulatory Visit: Payer: Self-pay | Admitting: Radiology

## 2018-06-08 DIAGNOSIS — Z1239 Encounter for other screening for malignant neoplasm of breast: Secondary | ICD-10-CM

## 2018-06-08 DIAGNOSIS — Z803 Family history of malignant neoplasm of breast: Secondary | ICD-10-CM

## 2018-07-10 DIAGNOSIS — B379 Candidiasis, unspecified: Secondary | ICD-10-CM | POA: Diagnosis not present

## 2018-07-10 DIAGNOSIS — N943 Premenstrual tension syndrome: Secondary | ICD-10-CM | POA: Diagnosis not present

## 2018-07-10 DIAGNOSIS — K219 Gastro-esophageal reflux disease without esophagitis: Secondary | ICD-10-CM | POA: Diagnosis not present

## 2018-07-24 DIAGNOSIS — N951 Menopausal and female climacteric states: Secondary | ICD-10-CM | POA: Diagnosis not present

## 2018-07-24 DIAGNOSIS — E039 Hypothyroidism, unspecified: Secondary | ICD-10-CM | POA: Diagnosis not present

## 2018-07-24 DIAGNOSIS — G4700 Insomnia, unspecified: Secondary | ICD-10-CM | POA: Diagnosis not present

## 2018-08-07 DIAGNOSIS — Z6831 Body mass index (BMI) 31.0-31.9, adult: Secondary | ICD-10-CM | POA: Diagnosis not present

## 2018-08-07 DIAGNOSIS — Z01419 Encounter for gynecological examination (general) (routine) without abnormal findings: Secondary | ICD-10-CM | POA: Diagnosis not present

## 2018-08-24 ENCOUNTER — Encounter: Payer: Self-pay | Admitting: Family Medicine

## 2018-08-25 MED ORDER — ZOLPIDEM TARTRATE 10 MG PO TABS: 10.0000 mg | ORAL_TABLET | Freq: Every evening | ORAL | 1 refills | Status: DC | PRN

## 2018-08-25 NOTE — Telephone Encounter (Signed)
Last filled 12/22/17 Last OV 05/16/17  Ok to fill?

## 2018-08-25 NOTE — Telephone Encounter (Signed)
I sent in the Zolpidem. The letter is ready to be downloaded from [employer]

## 2018-09-13 DIAGNOSIS — L821 Other seborrheic keratosis: Secondary | ICD-10-CM | POA: Diagnosis not present

## 2018-09-13 DIAGNOSIS — L573 Poikiloderma of Civatte: Secondary | ICD-10-CM | POA: Diagnosis not present

## 2018-09-13 DIAGNOSIS — D1801 Hemangioma of skin and subcutaneous tissue: Secondary | ICD-10-CM | POA: Diagnosis not present

## 2018-09-13 DIAGNOSIS — D225 Melanocytic nevi of trunk: Secondary | ICD-10-CM | POA: Diagnosis not present

## 2018-11-06 DIAGNOSIS — E039 Hypothyroidism, unspecified: Secondary | ICD-10-CM | POA: Diagnosis not present

## 2018-11-06 DIAGNOSIS — N951 Menopausal and female climacteric states: Secondary | ICD-10-CM | POA: Diagnosis not present

## 2018-11-06 DIAGNOSIS — R5383 Other fatigue: Secondary | ICD-10-CM | POA: Diagnosis not present

## 2018-11-23 ENCOUNTER — Other Ambulatory Visit: Payer: BC Managed Care – PPO

## 2018-12-06 ENCOUNTER — Other Ambulatory Visit: Payer: Self-pay | Admitting: Radiology

## 2018-12-14 DIAGNOSIS — Z20828 Contact with and (suspected) exposure to other viral communicable diseases: Secondary | ICD-10-CM | POA: Diagnosis not present

## 2018-12-18 ENCOUNTER — Other Ambulatory Visit: Payer: Self-pay

## 2018-12-18 DIAGNOSIS — Z20822 Contact with and (suspected) exposure to covid-19: Secondary | ICD-10-CM

## 2018-12-19 LAB — NOVEL CORONAVIRUS, NAA: SARS-CoV-2, NAA: NOT DETECTED

## 2018-12-20 ENCOUNTER — Encounter: Payer: Self-pay | Admitting: Family Medicine

## 2018-12-20 NOTE — Telephone Encounter (Signed)
Pt had a neg test and is wondering if she should retest due to coming into contact with her positive coworker.  Please advise

## 2018-12-21 NOTE — Telephone Encounter (Signed)
Set up a virtual OV do we can discuss this

## 2018-12-21 NOTE — Telephone Encounter (Signed)
Pt has been scheduled.  °

## 2018-12-22 ENCOUNTER — Encounter: Payer: Self-pay | Admitting: Family Medicine

## 2018-12-22 ENCOUNTER — Telehealth (INDEPENDENT_AMBULATORY_CARE_PROVIDER_SITE_OTHER): Payer: BC Managed Care – PPO | Admitting: Family Medicine

## 2018-12-22 DIAGNOSIS — R6889 Other general symptoms and signs: Secondary | ICD-10-CM | POA: Diagnosis not present

## 2018-12-22 NOTE — Progress Notes (Signed)
Virtual Visit via Video Note  I connected with the patient on 12/22/18 at 10:45 AM EST by a video enabled telemedicine application and verified that I am speaking with the correct person using two identifiers.  Location patient: home Location provider:work or home office Persons participating in the virtual visit: patient, provider  I discussed the limitations of evaluation and management by telemedicine and the availability of in person appointments. The patient expressed understanding and agreed to proceed.   HPI: Her for advice about exposure to the Covid-19 virus. 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 The attendants then separated themselves from the coworker, and they were all sent home. This coworker tested positive for the Covid virus the next day. Since coming home [name] Bowman has tested negative twice, on 12-14-18 and on 12-18-18. She has been quarantining at home. She feels fine except for a mild intermittent headache. She wants to return to work on 12-24-18.    ROS: See pertinent positives and negatives per HPI.  Past Medical History:  Diagnosis Date  . ANEMIA-NOS 03/29/2007  . Asymptomatic PVCs 01-21-11   worked up by Dr. Kenna Gilbert in 2009 with ECHO (benign), then saw Dr. Merrily Pew for exam (benign)  . CONSTIPATION 05/16/2008  . GERD 05/13/2008  . Headache(784.0) 05/12/2007  . History of exercise stress test 08-20-13   normal   . HYPERLIPIDEMIA 03/29/2007  . INSOMNIA 03/29/2007  . Patellar tendinitis 05/31/2008    Past Surgical History:  Procedure Laterality Date  . HAMMER TOE SURGERY  2012   right 4th and 5th toes  . LUMBAR LAMINECTOMY/DECOMPRESSION MICRODISCECTOMY Right 08/01/2015   Procedure: MICRO LUMBAR DECOMPRESSION L5-S1 RIGHT (1 LEVEL);  Surgeon: Jene Every, MD;  Location: [facility];  Service: Orthopedics;  Laterality: Right;  . mole removed from tear duct as a  child       Family History  Problem Relation Age of Onset  . Breast cancer Mother   . Diabetes Father   . Heart disease Father 60  . Breast cancer Maternal Grandmother   . Breast cancer Maternal Aunt   . Ovarian cancer Maternal Aunt   . Colon cancer Neg Hx      Current Outpatient Medications:  .  BLACK COHOSH PO, Take 400 mg by mouth daily. , Disp: , Rfl:  .  Chaste Tree (VITEX EXTRACT PO), Take 1,000 mg by mouth. , Disp: , Rfl:  .  Cholecalciferol (VITAMIN D PO), Take 5,000 Units by mouth., Disp: , Rfl:  .  EC-RX TESTOSTERONE 0.4 % CREA, Place 2 enemas onto the skin. , Disp: , Rfl:  .  MAGNESIUM OXIDE 400 PO, Take 400 mg by mouth., Disp: , Rfl:  .  medium chain triglycerides (MCT OIL) oil, Take by mouth daily., Disp: , Rfl:  .  Omega-3 Fatty Acids (SUPER OMEGA 3 PO), Take by mouth., Disp: , Rfl:  .  Pregnenolone POWD, by Does not apply route., Disp: , Rfl:  .  Probiotic Product (PROBIOTIC DAILY PO), Take 1 capsule by mouth daily. , Disp: , Rfl:  .  Progesterone Micronized (EC-RX PROGESTERONE TD), Place 2 capsules onto the skin. Pt taking 100 mg daily, Disp: , Rfl:  .  thyroid (ARMOUR) 32.5 MG tablet, Take 16.25 mg by mouth daily., Disp: , Rfl:  .  zolpidem (AMBIEN) 10 MG tablet, Take 1 tablet (10 mg total) by mouth at bedtime  as needed for sleep., Disp: 90 tablet, Rfl: 1 .  amoxicillin-clavulanate (AUGMENTIN) 875-125 MG tablet, Take 1 tablet by mouth 2 (two) times daily., Disp: 14 tablet, Rfl: 0 .  methocarbamol (ROBAXIN) 500 MG tablet, Take 1 tablet (500 mg total) by mouth at bedtime as needed for muscle spasms., Disp: 20 tablet, Rfl: 0 .  promethazine (PHENERGAN) 25 MG tablet, Take 1 tablet (25 mg total) by mouth every 8 (eight) hours as needed for nausea or vomiting., Disp: 20 tablet, Rfl: 0  EXAM:  VITALS per patient if applicable:  GENERAL: alert, oriented, appears well and in no acute distress  HEENT: atraumatic, conjunttiva clear, no obvious abnormalities on inspection  of external nose and ears  NECK: normal movements of the head and neck  LUNGS: on inspection no signs of respiratory distress, breathing rate appears normal, no obvious gross SOB, gasping or wheezing  CV: no obvious cyanosis  MS: moves all visible extremities without noticeable abnormality  PSYCH/NEURO: pleasant and cooperative, no obvious depression or anxiety, speech and thought processing grossly intact  ASSESSMENT AND PLAN: Exposure to the Covid-19 virus. We will excuse her from work from 12-12-18 until 12-24-18, when she can return to work. Recheck prn.  [name] Penna, MD  Discussed the following assessment and plan:  No diagnosis found.     I discussed the assessment and treatment plan with the patient. The patient was provided an opportunity to ask questions and all were answered. The patient agreed with the plan and demonstrated an understanding of the instructions.   The patient was advised to call back or seek an in-person evaluation if the symptoms worsen or if the condition fails to improve as anticipated.

## 2019-01-16 ENCOUNTER — Other Ambulatory Visit: Payer: Self-pay | Admitting: Radiology

## 2019-01-18 ENCOUNTER — Other Ambulatory Visit: Payer: Self-pay | Admitting: Radiology

## 2019-01-24 ENCOUNTER — Telehealth: Payer: Self-pay | Admitting: *Deleted

## 2019-01-24 NOTE — Telephone Encounter (Signed)
Copied from CRM [id number]. Topic: General - Other >> Jan 24, 2019 11:43 AM Dalphine Handing A wrote: Patient stated that the referral placed to the medical supply company stated that they received the prescription for the compression hose and shoes,however the "diagnosis" wasn't marked on the form. Patient stated that she needs form to be resent showing the diagnosis from Dr. Clent Ridges and would like a callback once this has been done. Please advise >> Jan 24, 2019 11:47 AM Dalphine Handing A wrote: Patient also noted that she can be sent a mychart message in regards to this as well

## 2019-01-25 NOTE — Telephone Encounter (Signed)
I clearly checked the box with the correct diagnosis code, please resend

## 2019-01-25 NOTE — Telephone Encounter (Signed)
Form has been refaxed.

## 2019-01-25 NOTE — Telephone Encounter (Signed)
Form has been placed in Dr. Claris Che folder

## 2019-02-12 ENCOUNTER — Ambulatory Visit
Admission: RE | Admit: 2019-02-12 | Discharge: 2019-02-12 | Disposition: A | Discharge status: Home / Self Care | Payer: BC Managed Care – PPO | Source: Ambulatory Visit | Attending: Radiology | Admitting: Radiology

## 2019-02-12 DIAGNOSIS — Z1239 Encounter for other screening for malignant neoplasm of breast: Secondary | ICD-10-CM

## 2019-02-12 DIAGNOSIS — Z803 Family history of malignant neoplasm of breast: Secondary | ICD-10-CM

## 2019-02-12 MED ORDER — GADOBUTROL 1 MMOL/ML IV SOLN
8.0000 mL | Freq: Once | INTRAVENOUS | Status: AC | PRN
  Administered 2019-02-12: 11:00:00 8 mL via INTRAVENOUS

## 2019-02-21 ENCOUNTER — Encounter: Payer: Self-pay | Admitting: Family Medicine

## 2019-03-30 ENCOUNTER — Telehealth: Payer: Self-pay | Admitting: Family Medicine

## 2019-03-30 NOTE — Telephone Encounter (Signed)
See below

## 2019-03-30 NOTE — Telephone Encounter (Signed)
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  She was supposed to return to work today03/19/2021. she would like a note to be out of work from 03/30/2019 through 03/ 21 / 2021.  Please advise pt contact number is [phone].

## 2019-03-30 NOTE — Telephone Encounter (Signed)
The work note is available on My Chart

## 2019-03-30 NOTE — Telephone Encounter (Signed)
Pt called and notified

## 2019-04-19 ENCOUNTER — Encounter: Payer: Self-pay | Admitting: Family Medicine

## 2019-04-20 MED ORDER — ZOLPIDEM TARTRATE 10 MG PO TABS: 10.0000 mg | ORAL_TABLET | Freq: Every evening | ORAL | 1 refills | Status: DC | PRN

## 2019-04-20 NOTE — Telephone Encounter (Signed)
Last filled 08/25/2018 Last OV 12/22/2018 (acute) Ok to fill?

## 2019-04-20 NOTE — Telephone Encounter (Signed)
Done

## 2019-06-25 ENCOUNTER — Telehealth: Payer: Self-pay | Admitting: Family Medicine

## 2019-06-25 NOTE — Telephone Encounter (Signed)
Pt is still having problem with her ears . Pt said she has  already seen a ENT doctor and the problem still persist [phone]

## 2019-06-25 NOTE — Telephone Encounter (Signed)
Patient is scheduled to see Dr. Clent Ridges 06/26/2019 at 11:15 AM

## 2019-06-26 ENCOUNTER — Encounter: Payer: Self-pay | Admitting: Family Medicine

## 2019-06-26 ENCOUNTER — Other Ambulatory Visit: Payer: Self-pay

## 2019-06-26 ENCOUNTER — Ambulatory Visit (INDEPENDENT_AMBULATORY_CARE_PROVIDER_SITE_OTHER): Payer: BC Managed Care – PPO | Admitting: Family Medicine

## 2019-06-26 VITALS — BP 122/62 | HR 97 | Temp 98.2°F | Wt 163.4 lb

## 2019-06-26 DIAGNOSIS — H60502 Unspecified acute noninfective otitis externa, left ear: Secondary | ICD-10-CM | POA: Diagnosis not present

## 2019-06-26 MED ORDER — CIPROFLOXACIN-DEXAMETHASONE 0.3-0.1 % OT SUSP: 4.0000 [drp] | Freq: Two times a day (BID) | OTIC | 0 refills | Status: DC

## 2019-06-26 NOTE — Progress Notes (Signed)
   Subjective:    Patient ID: [name] Bowman, female    DOB: September 29, 1975, 44 y.o.   MRN: [id number]  HPI Here for continued itching in both ear canals and now with 5 days of pain in the left ear. No fever or URI symptoms. She saw Dr. Maryellen Pile in March and he diagnosed chronic mycotic otitis externa. He treated this with Clotrimazole and Fluocinonide. This really did not help. She also saw a PA at that clinic later and she treated it with Dermotic solution. None of these helped much. [name] Bowman used to use Q tips in her ears, but she has stopped doing this.    Review of Systems  Constitutional: Negative.   HENT: Positive for ear pain. Negative for congestion, ear discharge, facial swelling, postnasal drip, sinus pressure and sneezing.   Eyes: Negative.   Respiratory: Negative.        Objective:   Physical Exam Constitutional:      Appearance: Normal appearance.  HENT:     Right Ear: Tympanic membrane, ear canal and external ear normal.     Left Ear: Tympanic membrane normal.     Ears:     Comments: The ear canal is swollen and red and tender. No cerumen is seen     Mouth/Throat:     Pharynx: Oropharynx is clear.  Eyes:     Conjunctiva/sclera: Conjunctivae normal.  Pulmonary:     Effort: Pulmonary effort is normal.     Breath sounds: Normal breath sounds.  Lymphadenopathy:     Cervical: No cervical adenopathy.  Neurological:     Mental Status: She is alert.           Assessment & Plan:  Bacterial otitis externa, treat with Ciprodex drops for 3-4 days. Recheck prn.  Gershon Crane, MD

## 2019-08-29 ENCOUNTER — Encounter: Payer: Self-pay | Admitting: Family Medicine

## 2019-08-29 ENCOUNTER — Other Ambulatory Visit: Payer: Self-pay

## 2019-08-29 ENCOUNTER — Ambulatory Visit: Payer: BC Managed Care – PPO | Admitting: Family Medicine

## 2019-08-29 VITALS — BP 122/60 | HR 102 | Temp 98.2°F | Wt 164.2 lb

## 2019-08-29 DIAGNOSIS — M26652 Arthropathy of left temporomandibular joint: Secondary | ICD-10-CM

## 2019-08-29 DIAGNOSIS — M26602 Left temporomandibular joint disorder, unspecified: Secondary | ICD-10-CM | POA: Diagnosis not present

## 2019-08-29 NOTE — Progress Notes (Signed)
   Subjective:    Patient ID: [name] Bowman, female    DOB: 10/29/1975, 44 y.o.   MRN: [id number]  HPI Here for the onset this morning of left ear pain. Her hearing is normal, no dizziness. No sinus congestion or PND or ST or cough. She tried some Ciprodex drops in her ear but this did not help. It does hurt to chew today.    Review of Systems  Constitutional: Negative.   HENT: Positive for ear pain. Negative for congestion, facial swelling, postnasal drip, sinus pressure, sinus pain and sore throat.   Eyes: Negative.   Respiratory: Negative.        Objective:   Physical Exam Constitutional:      Appearance: Normal appearance. She is not ill-appearing.  HENT:     Right Ear: Tympanic membrane, ear canal and external ear normal.     Left Ear: Tympanic membrane, ear canal and external ear normal.     Nose: Nose normal.     Mouth/Throat:     Pharynx: Oropharynx is clear.     Comments: The left TMJ is quite tender  Eyes:     Conjunctiva/sclera: Conjunctivae normal.  Pulmonary:     Effort: Pulmonary effort is normal.     Breath sounds: Normal breath sounds.  Lymphadenopathy:     Cervical: No cervical adenopathy.  Neurological:     Mental Status: She is alert.           Assessment & Plan:  TMJ pain. She can take Tylenol and apply warm compresses as needed. Do not chew gum. If this continues more than a few days she should consult her dentist.  Gershon Crane, MD

## 2019-08-31 ENCOUNTER — Telehealth: Payer: Self-pay | Admitting: Family Medicine

## 2019-08-31 ENCOUNTER — Encounter: Payer: Self-pay | Admitting: Family Medicine

## 2019-08-31 MED ORDER — AMOXICILLIN-POT CLAVULANATE 875-125 MG PO TABS: 1.0000 | ORAL_TABLET | Freq: Two times a day (BID) | ORAL | 0 refills | Status: DC

## 2019-08-31 NOTE — Telephone Encounter (Signed)
Rx sent in. Spoke with the patient. She is aware this medication has been sent in.

## 2019-08-31 NOTE — Telephone Encounter (Signed)
Call in Augmentin 875 BID for 10 days  

## 2019-08-31 NOTE — Telephone Encounter (Signed)
Pt had an appt on 8/18 regarding her L ear pain down to her jaw, now she cannot hear in that ear. She is not sure if PCP would like another appt or if he would call something in since she was seen recently?   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

## 2019-09-04 NOTE — Telephone Encounter (Signed)
Please get her such a note  

## 2019-09-11 DIAGNOSIS — H608X3 Other otitis externa, bilateral: Secondary | ICD-10-CM | POA: Diagnosis not present

## 2019-09-11 DIAGNOSIS — H9193 Unspecified hearing loss, bilateral: Secondary | ICD-10-CM | POA: Diagnosis not present

## 2019-09-13 DIAGNOSIS — D229 Melanocytic nevi, unspecified: Secondary | ICD-10-CM | POA: Diagnosis not present

## 2019-09-13 DIAGNOSIS — L819 Disorder of pigmentation, unspecified: Secondary | ICD-10-CM | POA: Diagnosis not present

## 2019-09-13 DIAGNOSIS — L821 Other seborrheic keratosis: Secondary | ICD-10-CM | POA: Diagnosis not present

## 2019-09-13 DIAGNOSIS — L814 Other melanin hyperpigmentation: Secondary | ICD-10-CM | POA: Diagnosis not present

## 2019-11-23 DIAGNOSIS — R5383 Other fatigue: Secondary | ICD-10-CM | POA: Diagnosis not present

## 2019-11-23 DIAGNOSIS — N951 Menopausal and female climacteric states: Secondary | ICD-10-CM | POA: Diagnosis not present

## 2019-11-23 DIAGNOSIS — G4700 Insomnia, unspecified: Secondary | ICD-10-CM | POA: Diagnosis not present

## 2019-11-23 DIAGNOSIS — E039 Hypothyroidism, unspecified: Secondary | ICD-10-CM | POA: Diagnosis not present

## 2019-12-24 DIAGNOSIS — R29898 Other symptoms and signs involving the musculoskeletal system: Secondary | ICD-10-CM | POA: Diagnosis not present

## 2019-12-31 ENCOUNTER — Other Ambulatory Visit: Payer: BC Managed Care – PPO

## 2019-12-31 DIAGNOSIS — Z20822 Contact with and (suspected) exposure to covid-19: Secondary | ICD-10-CM

## 2020-01-02 LAB — NOVEL CORONAVIRUS, NAA: SARS-CoV-2, NAA: NOT DETECTED

## 2020-01-02 LAB — SARS-COV-2, NAA 2 DAY TAT

## 2020-01-17 DIAGNOSIS — M545 Low back pain, unspecified: Secondary | ICD-10-CM | POA: Diagnosis not present

## 2020-01-25 ENCOUNTER — Other Ambulatory Visit: Payer: BC Managed Care – PPO

## 2020-01-25 ENCOUNTER — Other Ambulatory Visit: Payer: Self-pay | Admitting: Family Medicine

## 2020-01-25 DIAGNOSIS — Z20822 Contact with and (suspected) exposure to covid-19: Secondary | ICD-10-CM

## 2020-01-27 LAB — SARS-COV-2, NAA 2 DAY TAT

## 2020-01-27 LAB — NOVEL CORONAVIRUS, NAA: SARS-CoV-2, NAA: NOT DETECTED

## 2020-01-28 NOTE — Telephone Encounter (Signed)
Last office visit- 08/29/2019 Last refill--04/20/2019---90 tabs with 1 refill

## 2020-03-20 DIAGNOSIS — M79609 Pain in unspecified limb: Secondary | ICD-10-CM | POA: Diagnosis not present

## 2020-03-24 DIAGNOSIS — M1712 Unilateral primary osteoarthritis, left knee: Secondary | ICD-10-CM | POA: Diagnosis not present

## 2020-04-04 DIAGNOSIS — R5383 Other fatigue: Secondary | ICD-10-CM | POA: Diagnosis not present

## 2020-04-04 DIAGNOSIS — Z131 Encounter for screening for diabetes mellitus: Secondary | ICD-10-CM | POA: Diagnosis not present

## 2020-04-04 DIAGNOSIS — N943 Premenstrual tension syndrome: Secondary | ICD-10-CM | POA: Diagnosis not present

## 2020-04-04 DIAGNOSIS — E039 Hypothyroidism, unspecified: Secondary | ICD-10-CM | POA: Diagnosis not present

## 2020-04-04 DIAGNOSIS — R7982 Elevated C-reactive protein (CRP): Secondary | ICD-10-CM | POA: Diagnosis not present

## 2020-04-04 DIAGNOSIS — E559 Vitamin D deficiency, unspecified: Secondary | ICD-10-CM | POA: Diagnosis not present

## 2020-04-04 DIAGNOSIS — E782 Mixed hyperlipidemia: Secondary | ICD-10-CM | POA: Diagnosis not present

## 2020-05-23 DIAGNOSIS — Z01419 Encounter for gynecological examination (general) (routine) without abnormal findings: Secondary | ICD-10-CM | POA: Diagnosis not present

## 2020-05-23 DIAGNOSIS — Z1231 Encounter for screening mammogram for malignant neoplasm of breast: Secondary | ICD-10-CM | POA: Diagnosis not present

## 2020-05-23 DIAGNOSIS — Z6829 Body mass index (BMI) 29.0-29.9, adult: Secondary | ICD-10-CM | POA: Diagnosis not present

## 2020-05-26 DIAGNOSIS — R5383 Other fatigue: Secondary | ICD-10-CM | POA: Diagnosis not present

## 2020-05-26 DIAGNOSIS — N943 Premenstrual tension syndrome: Secondary | ICD-10-CM | POA: Diagnosis not present

## 2020-05-26 DIAGNOSIS — E039 Hypothyroidism, unspecified: Secondary | ICD-10-CM | POA: Diagnosis not present

## 2020-05-26 DIAGNOSIS — M79609 Pain in unspecified limb: Secondary | ICD-10-CM | POA: Diagnosis not present

## 2020-05-26 DIAGNOSIS — M25569 Pain in unspecified knee: Secondary | ICD-10-CM | POA: Diagnosis not present

## 2020-07-16 DIAGNOSIS — M79609 Pain in unspecified limb: Secondary | ICD-10-CM | POA: Diagnosis not present

## 2020-08-02 ENCOUNTER — Ambulatory Visit (HOSPITAL_COMMUNITY)
Admission: EM | Admit: 2020-08-02 | Discharge: 2020-08-02 | Disposition: A | Discharge status: Home / Self Care | Payer: BC Managed Care – PPO | Attending: Urgent Care | Admitting: Urgent Care

## 2020-08-02 ENCOUNTER — Encounter (HOSPITAL_COMMUNITY): Payer: Self-pay

## 2020-08-02 ENCOUNTER — Other Ambulatory Visit: Payer: Self-pay

## 2020-08-02 DIAGNOSIS — R519 Headache, unspecified: Secondary | ICD-10-CM

## 2020-08-02 DIAGNOSIS — H65191 Other acute nonsuppurative otitis media, right ear: Secondary | ICD-10-CM

## 2020-08-02 MED ORDER — PREDNISONE 20 MG PO TABS: ORAL_TABLET | ORAL | 0 refills | Status: DC

## 2020-08-02 NOTE — ED Triage Notes (Signed)
Pt c/o decreased hearing in both ears, after getting off of flight. She was having severe headache. States NP at job stated she had an ear infection and was taking prescribed antibiotics (amoxicillin) Started: Monday   Interventions: Advil- somewhat helpful, tylenol this morning- somewhat helpful

## 2020-08-02 NOTE — ED Provider Notes (Signed)
[facility]   MRN: [id number] DOB: [birth year]  Subjective:   [name] Bowman is a 45 y.o. female presenting for 5-day history of persistent and worsening headaches.  Patient states that headache is all over and is generally mild to moderate.  Initially her symptoms started out with bilateral ear fullness that went and clear.  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  She did have a visit with the nurse practitioner through her company and was prescribed amoxicillin which she is midway through.  She was advised that this was for an ear infection of the right side.  After she started taking amoxicillin her other symptoms improved except for headache.  She did take pseudoephedrine once but does not tolerate it well so stopped.  She is not taking any antihistamine or nasal spray.  Denies confusion, neck stiffness, fever, cough, chest pain, shortness of breath, rashes.  Reports that she typically responds well to this kind of problem with the steroid course.  Has not taken one in years.  No current facility-administered medications for this encounter.  Current Outpatient Medications:    amoxicillin-clavulanate (AUGMENTIN) 875-125 MG tablet, Take 1 tablet by mouth 2 (two) times daily., Disp: 20 tablet, Rfl: 0   BLACK COHOSH PO, Take 400 mg by mouth daily. , Disp: , Rfl:    Chaste Tree (VITEX EXTRACT PO), Take 1,000 mg by mouth. , Disp: , Rfl:    Cholecalciferol (VITAMIN D PO), Take 5,000 Units by mouth., Disp: , Rfl:    ciprofloxacin-dexamethasone (CIPRODEX) OTIC suspension, Place 4 drops into the left ear 2 (two) times daily., Disp: 7.5 mL, Rfl: 0   MAGNESIUM OXIDE 400 PO, Take 400 mg by mouth., Disp: , Rfl:    Omega-3 Fatty Acids (SUPER OMEGA 3 PO), Take by mouth., Disp: , Rfl:    Pregnenolone POWD, by Does not apply route., Disp: , Rfl:    Probiotic Product (PROBIOTIC DAILY PO), Take 1 capsule by mouth daily. ,  Disp: , Rfl:    thyroid (ARMOUR) 32.5 MG tablet, Take 16.25 mg by mouth daily., Disp: , Rfl:    zolpidem (AMBIEN) 10 MG tablet, TAKE 1 TABLET BY MOUTH AT BEDTIME AS NEEDED FOR SLEEP., Disp: 90 tablet, Rfl: 1   Allergies  Allergen Reactions   Sulfonamide Derivatives Rash    Past Medical History:  Diagnosis Date   ANEMIA-NOS 03/29/2007   Asymptomatic PVCs 01-21-11   worked up by Dr. Kenna Gilbert in 2009 with ECHO (benign), then saw Dr. Merrily Pew for exam (benign)   CONSTIPATION 05/16/2008   GERD 05/13/2008   Headache(784.0) 05/12/2007   History of exercise stress test 08-20-13   normal    HYPERLIPIDEMIA 03/29/2007   INSOMNIA 03/29/2007   Patellar tendinitis 05/31/2008     Past Surgical History:  Procedure Laterality Date   HAMMER TOE SURGERY  2012   right 4th and 5th toes   LUMBAR LAMINECTOMY/DECOMPRESSION MICRODISCECTOMY Right 08/01/2015   Procedure: MICRO LUMBAR DECOMPRESSION L5-S1 RIGHT (1 LEVEL);  Surgeon: Jene Every, MD;  Location: [facility];  Service: Orthopedics;  Laterality: Right;   mole removed from tear duct as a child       Family History  Problem Relation Age of Onset   Breast cancer Mother    Diabetes Father    Heart disease Father 67   Breast cancer Maternal Grandmother    Breast cancer Maternal Aunt    Ovarian cancer Maternal Aunt  Colon cancer Neg Hx     Social History   Tobacco Use   Smoking status: Never   Smokeless tobacco: Never  Vaping Use   Vaping Use: Never used  Substance Use Topics   Alcohol use: Yes    Alcohol/week: 1.0 standard drink    Types: 1 Standard drinks or equivalent per week    Comment: 1-2 glassses of wine or beer per week    Drug use: No    ROS   Objective:   Vitals: BP 122/84 (BP Location: Left Arm)   Pulse 85   Temp 98.7 F (37.1 C) (Oral)   Resp 20   LMP 05/30/2020   SpO2 98%   Physical Exam Constitutional:      General: She is not in acute distress.    Appearance: Normal appearance. She is well-developed. She is  not ill-appearing, toxic-appearing or diaphoretic.  HENT:     Head: Normocephalic and atraumatic.     Right Ear: Tympanic membrane, ear canal and external ear normal. No drainage or tenderness. No middle ear effusion. There is no impacted cerumen. Tympanic membrane is not erythematous.     Left Ear: Tympanic membrane, ear canal and external ear normal. No drainage or tenderness.  No middle ear effusion. There is no impacted cerumen. Tympanic membrane is not erythematous.     Nose: Nose normal. No congestion or rhinorrhea.     Mouth/Throat:     Mouth: Mucous membranes are moist. No oral lesions.     Pharynx: No pharyngeal swelling, oropharyngeal exudate, posterior oropharyngeal erythema or uvula swelling.     Tonsils: No tonsillar exudate or tonsillar abscesses.  Eyes:     General: No scleral icterus.       Right eye: No discharge.        Left eye: No discharge.     Extraocular Movements: Extraocular movements intact.     Right eye: Normal extraocular motion.     Left eye: Normal extraocular motion.     Conjunctiva/sclera: Conjunctivae normal.     Pupils: Pupils are equal, round, and reactive to light.  Cardiovascular:     Rate and Rhythm: Normal rate.  Pulmonary:     Effort: Pulmonary effort is normal.  Musculoskeletal:     Cervical back: Normal range of motion and neck supple. No tenderness.  Lymphadenopathy:     Cervical: No cervical adenopathy.  Skin:    General: Skin is warm and dry.  Neurological:     General: No focal deficit present.     Mental Status: She is alert and oriented to person, place, and time.     Cranial Nerves: No cranial nerve deficit.     Motor: No weakness.     Coordination: Coordination normal.     Gait: Gait normal.     Deep Tendon Reflexes: Reflexes normal.  Psychiatric:        Mood and Affect: Mood normal.        Behavior: Behavior normal.        Thought Content: Thought content normal.        Judgment: Judgment normal.    Assessment and Plan :    PDMP not reviewed this encounter.  1. Sinus headache   2. Other non-recurrent acute nonsuppurative otitis media of right ear     Finish amoxicillin course.  Recommended patient take an oral antihistamine but she insist that she does not tolerate these well.  Would prefer to avoid nasal steroids as I suspect she is  trying to clear a sinusitis that is manifesting with her persistent headache.  She cannot tolerate pseudoephedrine as well.  Therefore, I obliged patient with her request for aggressive management with a steroid course.  Recommended starting on oral antihistamine and nasal steroid thereafter for general management of what I suspect is allergic rhinitis, eustachian tube dysfunction given her travel and frequent exposure to changes in her pressure as a flight attendant. Counseled patient on potential for adverse effects with medications prescribed/recommended today, ER and return-to-clinic precautions discussed, patient verbalized understanding.    Wallis Bamberg, PA-C 08/02/20 1300

## 2020-08-04 ENCOUNTER — Encounter: Payer: Self-pay | Admitting: Gastroenterology

## 2020-08-14 DIAGNOSIS — N939 Abnormal uterine and vaginal bleeding, unspecified: Secondary | ICD-10-CM | POA: Diagnosis not present

## 2020-08-14 DIAGNOSIS — Z79890 Hormone replacement therapy (postmenopausal): Secondary | ICD-10-CM | POA: Diagnosis not present

## 2020-08-20 DIAGNOSIS — H11132 Conjunctival pigmentations, left eye: Secondary | ICD-10-CM | POA: Diagnosis not present

## 2020-08-26 ENCOUNTER — Other Ambulatory Visit: Payer: Self-pay

## 2020-08-26 ENCOUNTER — Other Ambulatory Visit: Payer: Self-pay | Admitting: Family Medicine

## 2020-08-26 ENCOUNTER — Other Ambulatory Visit: Payer: Self-pay | Admitting: Gastroenterology

## 2020-08-26 ENCOUNTER — Ambulatory Visit (AMBULATORY_SURGERY_CENTER): Payer: BC Managed Care – PPO

## 2020-08-26 VITALS — Ht 64.5 in | Wt 168.0 lb

## 2020-08-26 DIAGNOSIS — Z1211 Encounter for screening for malignant neoplasm of colon: Secondary | ICD-10-CM

## 2020-08-26 MED ORDER — PLENVU 140 G PO SOLR: 1.0000 | ORAL | 0 refills | Status: DC

## 2020-08-26 NOTE — Progress Notes (Signed)
Pre visit completed via phone call; Patient verified name, DOB, and address; No egg or soy allergy known to patient  No issues with past sedation with any surgeries or procedures Patient denies ever being told they had issues or difficulty with intubation  No FH of Malignant Hyperthermia No diet pills per patient No home 02 use per patient  No blood thinners per patient  Pt denies issues with constipation at this time; patient does report she increases fluids and increases "carbs and fiber to get things going";  No A fib or A flutter  EMMI video via MyChart  COVID 19 guidelines implemented in PV today with Pt and RN  Pt is fully vaccinated for Covid x 2; Coupon given to pt in PV today, Code to Pharmacy and NO PA's for preps discussed with pt in PV today  Discussed with pt there will be an out-of-pocket cost for prep and that varies from $0 to 70 dollars   Due to the COVID-19 pandemic we are asking patients to follow certain guidelines.  Pt aware of COVID protocols and [facility] guidelines

## 2020-09-01 ENCOUNTER — Telehealth: Payer: Self-pay | Admitting: Gastroenterology

## 2020-09-01 NOTE — Telephone Encounter (Signed)
Resent Plenvu instructions to pt- via My Chart per pt request

## 2020-09-01 NOTE — Telephone Encounter (Signed)
Pt just r/s her procedure to 10/21/20. She wants updated prep instructions sent via Mychart.

## 2020-09-10 ENCOUNTER — Ambulatory Visit: Payer: BC Managed Care – PPO | Admitting: Family Medicine

## 2020-09-10 ENCOUNTER — Encounter: Payer: Self-pay | Admitting: Family Medicine

## 2020-09-10 ENCOUNTER — Other Ambulatory Visit: Payer: Self-pay

## 2020-09-10 ENCOUNTER — Encounter: Payer: BC Managed Care – PPO | Admitting: Gastroenterology

## 2020-09-10 VITALS — BP 102/76 | HR 90 | Temp 98.7°F | Wt 169.0 lb

## 2020-09-10 DIAGNOSIS — H1131 Conjunctival hemorrhage, right eye: Secondary | ICD-10-CM | POA: Diagnosis not present

## 2020-09-10 LAB — BASIC METABOLIC PANEL
BUN: 12 mg/dL (ref 6–23)
CO2: 28 mEq/L (ref 19–32)
Calcium: 9.9 mg/dL (ref 8.4–10.5)
Chloride: 102 mEq/L (ref 96–112)
Creatinine, Ser: 0.99 mg/dL (ref 0.40–1.20)
GFR: 68.07 mL/min (ref >60.00)
Glucose, Bld: 81 mg/dL (ref 70–99)
Potassium: 4.2 mEq/L (ref 3.5–5.1)
Sodium: 139 mEq/L (ref 135–145)

## 2020-09-10 LAB — PROTIME-INR
INR: 0.9 ratio (ref 0.8–1.0)
Prothrombin Time: 10.2 s (ref 9.6–13.1)

## 2020-09-10 LAB — HEPATIC FUNCTION PANEL
ALT: 12 U/L (ref 0–35)
AST: 12 U/L (ref 0–37)
Albumin: 4.6 g/dL (ref 3.5–5.2)
Alkaline Phosphatase: 43 U/L (ref 39–117)
Bilirubin, Direct: 0.1 mg/dL (ref 0.0–0.3)
Total Bilirubin: 0.3 mg/dL (ref 0.2–1.2)
Total Protein: 7 g/dL (ref 6.0–8.3)

## 2020-09-10 LAB — TSH: TSH: 1.95 u[IU]/mL (ref 0.35–5.50)

## 2020-09-10 LAB — APTT: aPTT: 31 s (ref 23.4–32.7)

## 2020-09-10 NOTE — Progress Notes (Signed)
   Subjective:    Patient ID: [name] Bowman, female    DOB: Jan 17, 1975, 45 y.o.   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 She is getting over one right now actually. She averages 4-6 of these a year. They are not painful. She saw Dr. Lucretia Roers at the [facility] yesterday and he confirmed this diagnosis. Otherwise the eye is okay. She does not have trouble with easy bruising. She has stopped taking any medication that could thin her blood such as aspirin, ibuprofen, fish oil and turmeric. She does not wear contact lenses. 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    Review of Systems  Constitutional: Negative.   HENT: Negative.    Eyes:  Positive for redness. Negative for photophobia, pain and visual disturbance.  Respiratory: Negative.    Cardiovascular: Negative.   Hematological:  Does not bruise/bleed easily.      Objective:   Physical Exam Constitutional:      Appearance: Normal appearance. She is not ill-appearing.  HENT:     Right Ear: Tympanic membrane, ear canal and external ear normal.     Left Ear: Tympanic membrane, ear canal and external ear normal.     Nose: Nose normal.     Mouth/Throat:     Pharynx: Oropharynx is clear.  Eyes:     Comments: There is a subconjunctival hemorrhage in the inferior half of the right eye. The cornea is sparred. The left eye is clear   Cardiovascular:     Rate and Rhythm: Normal rate and regular rhythm.     Pulses: Normal pulses.     Heart sounds: Normal heart sounds.  Pulmonary:     Effort: Pulmonary effort is normal.     Breath sounds: Normal breath sounds.  Skin:    Comments: Clear, no bruising   Neurological:     Mental Status: She is alert.          Assessment & Plan:  Recurrent subconjunctival hemorrhages. We will check labs today for clotting function, etc.  Gershon Crane, MD

## 2020-09-10 NOTE — Addendum Note (Signed)
Addended by: Kandra Nicolas on: 09/10/2020 03:22 PM   Modules accepted: Orders

## 2020-09-11 ENCOUNTER — Encounter: Payer: Self-pay | Admitting: Family Medicine

## 2020-09-11 DIAGNOSIS — R6882 Decreased libido: Secondary | ICD-10-CM | POA: Diagnosis not present

## 2020-09-12 NOTE — Telephone Encounter (Signed)
Please get her a sick note as above

## 2020-09-16 DIAGNOSIS — D225 Melanocytic nevi of trunk: Secondary | ICD-10-CM | POA: Diagnosis not present

## 2020-09-16 DIAGNOSIS — L814 Other melanin hyperpigmentation: Secondary | ICD-10-CM | POA: Diagnosis not present

## 2020-09-16 DIAGNOSIS — L218 Other seborrheic dermatitis: Secondary | ICD-10-CM | POA: Diagnosis not present

## 2020-09-16 DIAGNOSIS — L821 Other seborrheic keratosis: Secondary | ICD-10-CM | POA: Diagnosis not present

## 2020-09-16 NOTE — Telephone Encounter (Signed)
Sick note sent to pt through MyChart

## 2020-10-10 ENCOUNTER — Encounter: Payer: Self-pay | Admitting: Gastroenterology

## 2020-10-21 ENCOUNTER — Encounter: Payer: BC Managed Care – PPO | Admitting: Gastroenterology

## 2020-10-29 ENCOUNTER — Ambulatory Visit (AMBULATORY_SURGERY_CENTER): Payer: BC Managed Care – PPO | Admitting: Gastroenterology

## 2020-10-29 ENCOUNTER — Other Ambulatory Visit: Payer: Self-pay

## 2020-10-29 ENCOUNTER — Encounter: Payer: Self-pay | Admitting: Gastroenterology

## 2020-10-29 VITALS — BP 106/66 | HR 81 | Temp 97.8°F | Resp 17 | Ht 64.5 in | Wt 168.0 lb

## 2020-10-29 DIAGNOSIS — Z1211 Encounter for screening for malignant neoplasm of colon: Secondary | ICD-10-CM | POA: Diagnosis not present

## 2020-10-29 HISTORY — PX: COLONOSCOPY: SHX174

## 2020-10-29 MED ORDER — SODIUM CHLORIDE 0.9 % IV SOLN: 500.0000 mL | Freq: Once | INTRAVENOUS | Status: DC

## 2020-10-29 NOTE — Patient Instructions (Signed)
YOU HAD AN ENDOSCOPIC PROCEDURE TODAY AT THE  ENDOSCOPY CENTER:   Refer to the procedure report that was given to you for any specific questions about what was found during the examination.  If the procedure report does not answer your questions, please call your gastroenterologist to clarify.  If you requested that your care partner not be given the details of your procedure findings, then the procedure report has been included in a sealed envelope for you to review at your convenience later. Please, read all of the handouts given to you by your recovery room nurse.   YOU SHOULD EXPECT: Some feelings of bloating in the abdomen. Passage of more gas than usual.  Walking can help get rid of the air that was put into your GI tract during the procedure and reduce the bloating. If you had a lower endoscopy (such as a colonoscopy or flexible sigmoidoscopy) you may notice spotting of blood in your stool or on the toilet paper. If you underwent a bowel prep for your procedure, you may not have a normal bowel movement for a few days.  Please Note:  You might notice some irritation and congestion in your nose or some drainage.  This is from the oxygen used during your procedure.  There is no need for concern and it should clear up in a day or so.  SYMPTOMS TO REPORT IMMEDIATELY:  Following lower endoscopy (colonoscopy or flexible sigmoidoscopy):  Excessive amounts of blood in the stool  Significant tenderness or worsening of abdominal pains  Swelling of the abdomen that is new, acute  Fever of 100F or higher    For urgent or emergent issues, a gastroenterologist can be reached at any hour by calling [phone]. Do not use MyChart messaging for urgent concerns.    DIET:  We do recommend a small meal at first, but then you may proceed to your regular diet.  Drink plenty of fluids but you should avoid alcoholic beverages for 24 hours.  ACTIVITY:  You should plan to take it easy for the rest of  today and you should NOT DRIVE or use heavy machinery until tomorrow (because of the sedation medicines used during the test).    FOLLOW UP: Our staff will call the number listed on your records 48-72 hours following your procedure to check on you and address any questions or concerns that you may have regarding the information given to you following your procedure. If we do not reach you, we will leave a message.  We will attempt to reach you two times.  During this call, we will ask if you have developed any symptoms of COVID 19. If you develop any symptoms (ie: fever, flu-like symptoms, shortness of breath, cough etc.) before then, please call [phone].  If you test positive for Covid 19 in the 2 weeks post procedure, please call and report this information to [place].     SIGNATURES/CONFIDENTIALITY: You and/or your care partner have signed paperwork which will be entered into your electronic medical record.  These signatures attest to the fact that that the information above on your After Visit Summary has been reviewed and is understood.  Full responsibility of the confidentiality of this discharge information lies with you and/or your care-partner.

## 2020-10-29 NOTE — Progress Notes (Signed)
Pt's states no medical or surgical changes since previsit or office visit. VS assessed by D.T 

## 2020-10-29 NOTE — Progress Notes (Signed)
Referring Provider: Laurey Morale, MD Primary Care Physician:  Laurey Morale, MD  Reason for Procedure:  Colon cancer screening   IMPRESSION:  Need for colon cancer screening Appropriate candidate for monitored anesthesia care  PLAN: Colonoscopy in the [place] today   HPI: [name] Bowman is a 45 y.o. female presents for screening colonoscopy.  No prior colonoscopy or colon cancer screening.  EGD in 2010 with H pylori gastritis.  Previously evaluated in 2015 for rectal bleeding attributed to internal hemorrhoids seen on anoscopy and treated conservatively.  No prior colonoscopy or colon cancer screening.    No known family history of colon cancer or polyps. No family history of uterine/endometrial cancer, pancreatic cancer or gastric/stomach cancer.   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 Marlowe Kays in 2009 with ECHO (benign), then saw Dr. 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   History of exercise stress test 08/20/2013   normal    HYPERLIPIDEMIA 03/29/2007   on meds   INSOMNIA 03/29/2007   Patellar tendinitis 05/31/2008   Seasonal allergies     Past Surgical History:  Procedure Laterality Date   HAMMER TOE SURGERY  01/11/2010   right 4th and 5th toes   LUMBAR LAMINECTOMY/DECOMPRESSION MICRODISCECTOMY Right 08/01/2015   Procedure: MICRO LUMBAR DECOMPRESSION L5-S1 RIGHT (1 LEVEL);  Surgeon: Susa Day, MD;  Location: [facility];  Service: Orthopedics;  Laterality: Right;   mole removed from tear duct as a child      UPPER GASTROINTESTINAL ENDOSCOPY  2010   WISDOM TOOTH EXTRACTION      Current Outpatient Medications  Medication Sig Dispense Refill   ARMOUR THYROID 15 MG tablet Take 15 mg by mouth daily.     Cholecalciferol (VITAMIN D PO) Take 5,000 Units by mouth.     Iodine Strong, Lugols, (IODINE  STRONG PO) Take 1 tablet by mouth daily at 6 (six) AM.     MAGNESIUM OXIDE 400 PO Take 400 mg by mouth.     Omega-3 Fatty Acids (SUPER OMEGA 3 PO) Take by mouth.     PEG-KCl-NaCl-NaSulf-Na Asc-C (PLENVU) 140 g SOLR Take 1 kit by mouth as directed. Manufacturer's coupon Universal coupon code:BIN: P2366821; GROUP: [id number]; PCN: [id number]; ID: [id number]; PAY NO MORE $50; NO prior authorization 1 each 0   Phenylephrine-guaiFENesin (GENEXA LA PO) Take 2 tablets by mouth daily as needed.     Pregnenolone POWD by Does not apply route. Oral medication     Probiotic Product (PROBIOTIC DAILY PO) Take 1 capsule by mouth daily.      progesterone (PROMETRIUM) 200 MG capsule Take 1 capsule by mouth daily at 6 (six) AM.     testosterone cypionate (DEPOTESTOSTERONE CYPIONATE) 200 MG/ML injection Inject 100 mg into the muscle every 28 (twenty-eight) days.     zolpidem (AMBIEN) 10 MG tablet TAKE 1 TABLET BY MOUTH AT BEDTIME AS NEEDED FOR SLEEP. 90 tablet 1   Current Facility-Administered Medications  Medication Dose Route Frequency Provider Last Rate Last Admin   0.9 %  sodium chloride infusion  500 mL Intravenous Once Thornton Park, MD        Allergies as of 10/29/2020 - Review Complete 10/29/2020  Allergen Reaction Noted   Sulfonamide derivatives Rash 03/29/2007    Family History  Problem Relation Age of Onset   Breast cancer Mother  Colon polyps Father    Diabetes Father    Heart disease Father 39   Breast cancer Maternal Aunt    Ovarian cancer Maternal Aunt    Breast cancer Maternal Grandmother    Colon cancer Neg Hx    Esophageal cancer Neg Hx    Rectal cancer Neg Hx    Stomach cancer Neg Hx      Physical Exam: General:   Alert,  well-nourished, pleasant and cooperative in NAD Head:  Normocephalic and atraumatic. Eyes:  Sclera clear, no icterus.   Conjunctiva pink. Mouth:  No deformity or lesions.   Neck:  Supple; no masses or thyromegaly. Lungs:  Clear throughout to auscultation.    No wheezes. Heart:  Regular rate and rhythm; no murmurs. Abdomen:  Soft, non-tender, nondistended, normal bowel sounds, no rebound or guarding.  Msk:  Symmetrical. No boney deformities LAD: No inguinal or umbilical LAD Extremities:  No clubbing or edema. Neurologic:  Alert and  oriented x4;  grossly nonfocal Skin:  No obvious rash or bruise. Psych:  Alert and cooperative. Normal mood and affect.      [name] L. Tarri Glenn, MD, MPH 10/29/2020, 9:40 AM

## 2020-10-29 NOTE — Progress Notes (Signed)
PT taken to PACU. Monitors in place. VSS. Report given to RN. 

## 2020-10-29 NOTE — Op Note (Signed)
[facility] Patient Name: [name] Bowman Procedure Date: 10/29/2020 10:20 AM MRN: [id number] Endoscopist: Tressia Danas MD, MD Age: 45 Referring MD:  Date of Birth: [birth year] Gender: Female Account #: [id number] Procedure:                Colonoscopy Indications:              Screening for colorectal malignant neoplasm, This                            is the patient's first colonoscopy                           No known family history of colon cancer or polyps Medicines:                Monitored Anesthesia Care Procedure:                Pre-Anesthesia Assessment:                           - Prior to the procedure, a History and Physical                            was performed, and patient medications and                            allergies were reviewed. The patient's tolerance of                            previous anesthesia was also reviewed. The risks                            and benefits of the procedure and the sedation                            options and risks were discussed with the patient.                            All questions were answered, and informed consent                            was obtained. Prior Anticoagulants: The patient has                            taken no previous anticoagulant or antiplatelet                            agents. ASA Grade Assessment: II - A patient with                            mild systemic disease. After reviewing the risks                            and benefits, the patient was deemed in  satisfactory condition to undergo the procedure.                           After obtaining informed consent, the colonoscope                            was passed under direct vision. Throughout the                            procedure, the patient's blood pressure, pulse, and                            oxygen saturations were monitored continuously. The                            CF [id number]  #[id number] was introduced through the anus                            and advanced to the 3 cm into the ileum. A second                            forward view of the right colon was performed. The                            colonoscopy was performed without difficulty. The                            patient tolerated the procedure well. The quality                            of the bowel preparation was good. The terminal                            ileum, ileocecal valve, appendiceal orifice, and                            rectum were photographed. Scope In: 10:32:50 AM Scope Out: 10:43:56 AM Scope Withdrawal Time: 0 hours 7 minutes 48 seconds  Total Procedure Duration: 0 hours 11 minutes 6 seconds  Findings:                 The perianal and digital rectal examinations were                            normal.                           The entire examined colon appeared normal on direct                            and retroflexion views. Complications:            No immediate complications. Estimated Blood Loss:     Estimated blood loss: none. Impression:               -  The entire examined colon is normal on direct and                            retroflexion views.                           - No specimens collected. Recommendation:           - Patient has a contact number available for                            emergencies. The signs and symptoms of potential                            delayed complications were discussed with the                            patient. Return to normal activities tomorrow.                            Written discharge instructions were provided to the                            patient.                           - Resume previous diet.                           - Continue present medications.                           - Repeat colonoscopy in 10 years for surveillance,                            earlier with new symptoms.                           - Emerging  evidence supports eating a diet of                            fruits, vegetables, grains, calcium, and yogurt                            while reducing red meat and alcohol may reduce the                            risk of colon cancer.                           - Thank you for allowing me to be involved in your                            colon cancer prevention. Tressia Danas MD, MD 10/29/2020 10:49:23 AM This report has been signed electronically.

## 2020-11-17 ENCOUNTER — Encounter: Payer: Self-pay | Admitting: Family Medicine

## 2020-11-17 DIAGNOSIS — R6882 Decreased libido: Secondary | ICD-10-CM | POA: Diagnosis not present

## 2020-11-17 MED ORDER — ZOLPIDEM TARTRATE 10 MG PO TABS: 10.0000 mg | ORAL_TABLET | Freq: Every evening | ORAL | 1 refills | Status: DC | PRN

## 2020-11-17 NOTE — Telephone Encounter (Signed)
Done

## 2020-11-24 DIAGNOSIS — H938X1 Other specified disorders of right ear: Secondary | ICD-10-CM | POA: Diagnosis not present

## 2020-11-24 DIAGNOSIS — H60333 Swimmer's ear, bilateral: Secondary | ICD-10-CM | POA: Diagnosis not present

## 2020-11-28 DIAGNOSIS — G4700 Insomnia, unspecified: Secondary | ICD-10-CM | POA: Diagnosis not present

## 2020-11-28 DIAGNOSIS — R5383 Other fatigue: Secondary | ICD-10-CM | POA: Diagnosis not present

## 2020-11-28 DIAGNOSIS — E039 Hypothyroidism, unspecified: Secondary | ICD-10-CM | POA: Diagnosis not present

## 2020-11-28 DIAGNOSIS — N951 Menopausal and female climacteric states: Secondary | ICD-10-CM | POA: Diagnosis not present

## 2021-01-09 ENCOUNTER — Ambulatory Visit: Payer: BC Managed Care – PPO | Admitting: Family Medicine

## 2021-01-13 ENCOUNTER — Ambulatory Visit: Payer: BC Managed Care – PPO | Admitting: Family Medicine

## 2021-01-13 DIAGNOSIS — J3089 Other allergic rhinitis: Secondary | ICD-10-CM | POA: Diagnosis not present

## 2021-01-14 DIAGNOSIS — R6882 Decreased libido: Secondary | ICD-10-CM | POA: Diagnosis not present

## 2021-01-22 DIAGNOSIS — E559 Vitamin D deficiency, unspecified: Secondary | ICD-10-CM | POA: Diagnosis not present

## 2021-01-22 DIAGNOSIS — E663 Overweight: Secondary | ICD-10-CM | POA: Diagnosis not present

## 2021-01-22 DIAGNOSIS — N943 Premenstrual tension syndrome: Secondary | ICD-10-CM | POA: Diagnosis not present

## 2021-01-22 DIAGNOSIS — E039 Hypothyroidism, unspecified: Secondary | ICD-10-CM | POA: Diagnosis not present

## 2021-01-22 DIAGNOSIS — R5383 Other fatigue: Secondary | ICD-10-CM | POA: Diagnosis not present

## 2021-01-22 DIAGNOSIS — E782 Mixed hyperlipidemia: Secondary | ICD-10-CM | POA: Diagnosis not present

## 2021-01-22 DIAGNOSIS — N951 Menopausal and female climacteric states: Secondary | ICD-10-CM | POA: Diagnosis not present

## 2021-01-23 DIAGNOSIS — Z130 Encounter for screening for diseases of the blood and blood-forming organs and certain disorders involving the immune mechanism: Secondary | ICD-10-CM | POA: Diagnosis not present

## 2021-01-23 DIAGNOSIS — Z13228 Encounter for screening for other metabolic disorders: Secondary | ICD-10-CM | POA: Diagnosis not present

## 2021-01-23 DIAGNOSIS — Z1329 Encounter for screening for other suspected endocrine disorder: Secondary | ICD-10-CM | POA: Diagnosis not present

## 2021-01-23 DIAGNOSIS — Z13220 Encounter for screening for lipoid disorders: Secondary | ICD-10-CM | POA: Diagnosis not present

## 2021-01-23 DIAGNOSIS — Z131 Encounter for screening for diabetes mellitus: Secondary | ICD-10-CM | POA: Diagnosis not present

## 2021-01-23 DIAGNOSIS — Z1321 Encounter for screening for nutritional disorder: Secondary | ICD-10-CM | POA: Diagnosis not present

## 2021-01-26 DIAGNOSIS — N943 Premenstrual tension syndrome: Secondary | ICD-10-CM | POA: Diagnosis not present

## 2021-01-26 DIAGNOSIS — E039 Hypothyroidism, unspecified: Secondary | ICD-10-CM | POA: Diagnosis not present

## 2021-01-26 DIAGNOSIS — G4700 Insomnia, unspecified: Secondary | ICD-10-CM | POA: Diagnosis not present

## 2021-01-26 DIAGNOSIS — R5383 Other fatigue: Secondary | ICD-10-CM | POA: Diagnosis not present

## 2021-02-04 ENCOUNTER — Encounter: Payer: Self-pay | Admitting: Podiatry

## 2021-02-04 ENCOUNTER — Other Ambulatory Visit: Payer: Self-pay

## 2021-02-04 ENCOUNTER — Ambulatory Visit: Payer: BC Managed Care – PPO | Admitting: Podiatry

## 2021-02-04 DIAGNOSIS — L600 Ingrowing nail: Secondary | ICD-10-CM

## 2021-02-04 MED ORDER — DOXYCYCLINE HYCLATE 100 MG PO TABS: 100.0000 mg | ORAL_TABLET | Freq: Two times a day (BID) | ORAL | 0 refills | Status: AC

## 2021-02-09 ENCOUNTER — Ambulatory Visit (INDEPENDENT_AMBULATORY_CARE_PROVIDER_SITE_OTHER): Payer: BC Managed Care – PPO | Admitting: Family Medicine

## 2021-02-09 ENCOUNTER — Encounter: Payer: Self-pay | Admitting: Family Medicine

## 2021-02-09 VITALS — BP 98/74 | HR 72 | Temp 98.7°F | Ht 64.0 in | Wt 170.2 lb

## 2021-02-09 DIAGNOSIS — Z0000 Encounter for general adult medical examination without abnormal findings: Secondary | ICD-10-CM

## 2021-02-09 LAB — CBC WITH DIFFERENTIAL/PLATELET
Basophils Absolute: 0 10*3/uL (ref 0.0–0.1)
Basophils Relative: 0.9 % (ref 0.0–3.0)
Eosinophils Absolute: 0.2 10*3/uL (ref 0.0–0.7)
Eosinophils Relative: 2.8 % (ref 0.0–5.0)
HCT: 44.3 % (ref 36.0–46.0)
Hemoglobin: 15 g/dL (ref 12.0–15.0)
Lymphocytes Relative: 43.6 % (ref 12.0–46.0)
Lymphs Abs: 2.5 10*3/uL (ref 0.7–4.0)
MCHC: 33.8 g/dL (ref 30.0–36.0)
MCV: 93.4 fl (ref 78.0–100.0)
Monocytes Absolute: 0.3 10*3/uL (ref 0.1–1.0)
Monocytes Relative: 5.8 % (ref 3.0–12.0)
Neutro Abs: 2.6 10*3/uL (ref 1.4–7.7)
Neutrophils Relative %: 46.9 % (ref 43.0–77.0)
Platelets: 322 10*3/uL (ref 150.0–400.0)
RBC: 4.74 Mil/uL (ref 3.87–5.11)
RDW: 12.9 % (ref 11.5–15.5)
WBC: 5.6 10*3/uL (ref 4.0–10.5)

## 2021-02-09 LAB — BASIC METABOLIC PANEL
BUN: 20 mg/dL (ref 6–23)
CO2: 28 mEq/L (ref 19–32)
Calcium: 9.6 mg/dL (ref 8.4–10.5)
Chloride: 101 mEq/L (ref 96–112)
Creatinine, Ser: 1.08 mg/dL (ref 0.40–1.20)
GFR: 61.15 mL/min (ref >60.00)
Glucose, Bld: 87 mg/dL (ref 70–99)
Potassium: 4.2 mEq/L (ref 3.5–5.1)
Sodium: 138 mEq/L (ref 135–145)

## 2021-02-09 LAB — HEPATIC FUNCTION PANEL
ALT: 13 U/L (ref 0–35)
AST: 14 U/L (ref 0–37)
Albumin: 4.7 g/dL (ref 3.5–5.2)
Alkaline Phosphatase: 41 U/L (ref 39–117)
Bilirubin, Direct: 0.1 mg/dL (ref 0.0–0.3)
Total Bilirubin: 0.6 mg/dL (ref 0.2–1.2)
Total Protein: 7.3 g/dL (ref 6.0–8.3)

## 2021-02-09 LAB — HEMOGLOBIN A1C: Hgb A1c MFr Bld: 5 % (ref 4.6–6.5)

## 2021-02-09 LAB — LIPID PANEL
Cholesterol: 220 mg/dL — ABNORMAL HIGH (ref 0–200)
HDL: 49.1 mg/dL (ref >39.00)
LDL Cholesterol: 142 mg/dL — ABNORMAL HIGH (ref 0–99)
NonHDL: 171.06
Total CHOL/HDL Ratio: 4
Triglycerides: 145 mg/dL (ref 0.0–149.0)
VLDL: 29 mg/dL (ref 0.0–40.0)

## 2021-02-09 NOTE — Progress Notes (Signed)
° °  Subjective:    Patient ID: [name] Bowman, female    DOB: May 31, 1975, 46 y.o.   MRN: [id number]  HPI Here for a well exam. She feels well in general. She has been seeing Dr. Julien Nordmann at [facility] in [place], and she has started Maudie Mercury on [employer] thyroid, testosterone, and progesterone. Last week she had ingrown toenails removed from both great toes by Dr. Posey Pronto with [facility]. She recently started on the [employer] program.    Review of Systems  Constitutional: Negative.   HENT: Negative.    Eyes: Negative.   Respiratory: Negative.    Cardiovascular: Negative.   Gastrointestinal: Negative.   Genitourinary:  Negative for decreased urine volume, difficulty urinating, dyspareunia, dysuria, enuresis, flank pain, frequency, hematuria, pelvic pain and urgency.  Musculoskeletal: Negative.   Skin: Negative.   Neurological: Negative.  Negative for headaches.  Psychiatric/Behavioral: Negative.        Objective:   Physical Exam Constitutional:      General: She is not in acute distress.    Appearance: Normal appearance. She is well-developed.  HENT:     Head: Normocephalic and atraumatic.     Right Ear: External ear normal.     Left Ear: External ear normal.     Nose: Nose normal.     Mouth/Throat:     Pharynx: No oropharyngeal exudate.  Eyes:     General: No scleral icterus.    Conjunctiva/sclera: Conjunctivae normal.     Pupils: Pupils are equal, round, and reactive to light.  Neck:     Thyroid: No thyromegaly.     Vascular: No JVD.  Cardiovascular:     Rate and Rhythm: Normal rate and regular rhythm.     Heart sounds: Normal heart sounds. No murmur heard.   No friction rub. No gallop.  Pulmonary:     Effort: Pulmonary effort is normal. No respiratory distress.     Breath sounds: Normal breath sounds. No wheezing or rales.  Chest:     Chest wall: No tenderness.  Abdominal:     General: Bowel sounds are normal. There is no distension.      Palpations: Abdomen is soft. There is no mass.     Tenderness: There is no abdominal tenderness. There is no guarding or rebound.  Musculoskeletal:        General: No tenderness. Normal range of motion.     Cervical back: Normal range of motion and neck supple.  Lymphadenopathy:     Cervical: No cervical adenopathy.  Skin:    General: Skin is warm and dry.     Findings: No erythema or rash.  Neurological:     Mental Status: She is alert and oriented to person, place, and time.     Cranial Nerves: No cranial nerve deficit.     Motor: No abnormal muscle tone.     Coordination: Coordination normal.     Deep Tendon Reflexes: Reflexes are normal and symmetric. Reflexes normal.  Psychiatric:        Behavior: Behavior normal.        Thought Content: Thought content normal.        Judgment: Judgment normal.          Assessment & Plan:  Well exam. We discussed diet and exercise. Get fasting labs. Alysia Penna, MD

## 2021-02-10 ENCOUNTER — Encounter: Payer: Self-pay | Admitting: Podiatry

## 2021-02-10 NOTE — Progress Notes (Signed)
Subjective:  Patient ID: [name] Bowman, female    DOB: 24-Sep-1975,  MRN: [id number]  Chief Complaint  Patient presents with   Ingrown Toenail    Possible ingrown nail     46 y.o. female presents with the above complaint.  Patient presents with bilateral medial border ingrown.  Patient states is tender to touch painful to touch.  She would like to have it removed.  She has not seen anyone else prior to seeing me.  Hurts with ambulation.  She has not taken any antibiotics.  There is some redness associated with it.   Review of Systems: Negative except as noted in the HPI. Denies N/V/F/Ch.  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 Marlowe Kays in 2009 with ECHO (benign), then saw Dr. Ricky Ala for exam (benign)   CONSTIPATION 05/16/2008   GERD 123456   with certain foods   [id number]) 05/12/2007   History of exercise stress test 08/20/2013   normal    HYPERLIPIDEMIA 03/29/2007   on meds   INSOMNIA 03/29/2007   Patellar tendinitis 05/31/2008   Seasonal allergies     Current Outpatient Medications:    doxycycline (VIBRA-TABS) 100 MG tablet, Take 1 tablet (100 mg total) by mouth 2 (two) times daily for 10 days., Disp: 20 tablet, Rfl: 0   ARMOUR THYROID 15 MG tablet, Take 15 mg by mouth daily., Disp: , Rfl:    ESTRADIOL PO, Take 1 mg by mouth daily at 12 noon., Disp: , Rfl:    Multiple Vitamin (MULTIVITAMIN PO), Take by mouth daily at 12 noon. OTC, Disp: , Rfl:    Probiotic Product (PROBIOTIC DAILY PO), Take 1 capsule by mouth daily., Disp: , Rfl:    progesterone (PROMETRIUM) 200 MG capsule, Take 1 capsule by mouth daily at 6 (six) AM., Disp: , Rfl:    testosterone cypionate (DEPOTESTOSTERONE CYPIONATE) 200 MG/ML injection, Inject 100 mg into the muscle every 28 (twenty-eight) days., Disp: , Rfl:    zolpidem (AMBIEN) 10 MG tablet, Take 1 tablet (10 mg total) by mouth at bedtime as needed. for sleep,  Disp: 90 tablet, Rfl: 1  Social History   Tobacco Use  Smoking Status Never  Smokeless Tobacco Never    Allergies  Allergen Reactions   Sulfonamide Derivatives Rash   Objective:  There were no vitals filed for this visit. There is no height or weight on file to calculate BMI. Constitutional Well developed. Well nourished.  Vascular Dorsalis pedis pulses palpable bilaterally. Posterior tibial pulses palpable bilaterally. Capillary refill normal to all digits.  No cyanosis or clubbing noted. Pedal hair growth normal.  Neurologic Normal speech. Oriented to person, place, and time. Epicritic sensation to light touch grossly present bilaterally.  Dermatologic Painful ingrowing nail at medial nail borders of the hallux nail bilaterally. No other open wounds. No skin lesions.  Orthopedic: Normal joint ROM without pain or crepitus bilaterally. No visible deformities. No bony tenderness.   Radiographs: None Assessment:   1. Ingrown toenail of right foot   2. Ingrown left big toenail    Plan:  Patient was evaluated and treated and all questions answered.  Ingrown Nail, bilaterally -Patient elects to proceed with minor surgery to remove ingrown toenail removal today. Consent reviewed and signed by patient. -Ingrown nail excised. See procedure note. -Educated on post-procedure care including soaking. Written instructions provided and reviewed. -Patient to follow up in 2 weeks for nail check.  Procedure:  Excision of Ingrown Toenail Location: Bilateral 1st toe medial nail borders. Anesthesia: Lidocaine 1% plain; 1.5 mL and Marcaine 0.5% plain; 1.5 mL, digital block. Skin Prep: Betadine. Dressing: Silvadene; telfa; dry, sterile, compression dressing. Technique: Following skin prep, the toe was exsanguinated and a tourniquet was secured at the base of the toe. The affected nail border was freed, split with a nail splitter, and excised. Chemical matrixectomy was then performed  with phenol and irrigated out with alcohol. The tourniquet was then removed and sterile dressing applied. Disposition: Patient tolerated procedure well. Patient to return in 2 weeks for follow-up.   No follow-ups on file.

## 2021-02-13 ENCOUNTER — Other Ambulatory Visit: Payer: Self-pay

## 2021-02-13 ENCOUNTER — Ambulatory Visit: Payer: BC Managed Care – PPO | Admitting: Podiatry

## 2021-02-13 DIAGNOSIS — L600 Ingrowing nail: Secondary | ICD-10-CM

## 2021-02-13 DIAGNOSIS — L03031 Cellulitis of right toe: Secondary | ICD-10-CM

## 2021-02-13 NOTE — Progress Notes (Signed)
Subjective:  Patient ID: [name] Bowman, female    DOB: April 18, 1975,  MRN: [id number]  Chief Complaint  Patient presents with   Nail Problem    Right hallux toe nail is red and swollen tender to the touch  Pt is still taking doxy     46 y.o. female presents with the above complaint.  Patient presents with a new complaint of right lateral border hallux ingrown.  Patient states feeling a little bit better but she has been taking antibiotics.  She wanted get it evaluated.  The medial side that I took off last time seems to be healing well.  No acute complaints there.  Review of Systems: Negative except as noted in the HPI. Denies N/V/F/Ch.  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 Kenna Gilbert in 2009 with ECHO (benign), then saw Dr. Merrily Pew for exam (benign)   CONSTIPATION 05/16/2008   GERD 05/13/2008   with certain foods   Headache(784.0) 05/12/2007   History of exercise stress test 08/20/2013   normal    HYPERLIPIDEMIA 03/29/2007   on meds   INSOMNIA 03/29/2007   Patellar tendinitis 05/31/2008   Seasonal allergies     Current Outpatient Medications:    ARMOUR THYROID 15 MG tablet, Take 15 mg by mouth daily., Disp: , Rfl:    doxycycline (VIBRA-TABS) 100 MG tablet, Take 1 tablet (100 mg total) by mouth 2 (two) times daily for 10 days., Disp: 20 tablet, Rfl: 0   ESTRADIOL PO, Take 1 mg by mouth daily at 12 noon., Disp: , Rfl:    Multiple Vitamin (MULTIVITAMIN PO), Take by mouth daily at 12 noon. OTC, Disp: , Rfl:    Probiotic Product (PROBIOTIC DAILY PO), Take 1 capsule by mouth daily., Disp: , Rfl:    progesterone (PROMETRIUM) 200 MG capsule, Take 1 capsule by mouth daily at 6 (six) AM., Disp: , Rfl:    testosterone cypionate (DEPOTESTOSTERONE CYPIONATE) 200 MG/ML injection, Inject 100 mg into the muscle every 28 (twenty-eight) days., Disp: , Rfl:    zolpidem (AMBIEN) 10 MG tablet, Take 1 tablet (10 mg  total) by mouth at bedtime as needed. for sleep, Disp: 90 tablet, Rfl: 1  Social History   Tobacco Use  Smoking Status Never  Smokeless Tobacco Never    Allergies  Allergen Reactions   Sulfonamide Derivatives Rash   Objective:  There were no vitals filed for this visit. There is no height or weight on file to calculate BMI. Constitutional Well developed. Well nourished.  Vascular Dorsalis pedis pulses palpable bilaterally. Posterior tibial pulses palpable bilaterally. Capillary refill normal to all digits.  No cyanosis or clubbing noted. Pedal hair growth normal.  Neurologic Normal speech. Oriented to person, place, and time. Epicritic sensation to light touch grossly present bilaterally.  Dermatologic Painful ingrowing nail at lateral nail borders of the hallux nail right side.  The medial borders of both hallux are healing well. No other open wounds. No skin lesions.  Orthopedic: Normal joint ROM without pain or crepitus bilaterally. No visible deformities. No bony tenderness.   Radiographs: None Assessment:   1. Paronychia of toe of right foot due to ingrown toenail     Plan:  Patient was evaluated and treated and all questions answered.  Ingrown Nail, right lateral border paronychia -Patient is assuming more pain and discomfort to the right lateral border.  The medial borders are healing well. -At this time clinically she  is able to tolerate the pain and would like for me to just do a slant back procedure.  If it continues to hurt her I have asked her that we may have to do an official ingrown the right lateral border.  Patient states understanding -Continue and finish taking doxycycline to help with the redness.  If there is no resolve meant of pain and continues to records have asked her to come back and see me.  She states understanding  No follow-ups on file.

## 2021-02-16 ENCOUNTER — Telehealth: Payer: Self-pay | Admitting: Podiatry

## 2021-02-16 NOTE — Telephone Encounter (Signed)
Patient would like to return to work 02/24/21, she is scheduled to return to work 03/18/2021. Patient is a Education officer, community.  [name] Bowman said that she may be able to wear compression sockings with the toes out. Please advise?

## 2021-02-18 NOTE — Telephone Encounter (Signed)
That's fine for her to return to work on 2/14. She can come in and pick up a return to work note if needed. - Dr. Logan Bores

## 2021-02-23 ENCOUNTER — Encounter: Payer: Self-pay | Admitting: Podiatry

## 2021-02-23 DIAGNOSIS — R7989 Other specified abnormal findings of blood chemistry: Secondary | ICD-10-CM | POA: Diagnosis not present

## 2021-02-23 DIAGNOSIS — N951 Menopausal and female climacteric states: Secondary | ICD-10-CM | POA: Diagnosis not present

## 2021-03-23 DIAGNOSIS — M25562 Pain in left knee: Secondary | ICD-10-CM | POA: Diagnosis not present

## 2021-03-23 DIAGNOSIS — M25561 Pain in right knee: Secondary | ICD-10-CM | POA: Diagnosis not present

## 2021-05-22 DIAGNOSIS — H93293 Other abnormal auditory perceptions, bilateral: Secondary | ICD-10-CM | POA: Diagnosis not present

## 2021-05-22 DIAGNOSIS — H608X3 Other otitis externa, bilateral: Secondary | ICD-10-CM | POA: Diagnosis not present

## 2021-07-16 ENCOUNTER — Encounter: Payer: Self-pay | Admitting: Family Medicine

## 2021-07-16 ENCOUNTER — Ambulatory Visit: Payer: BC Managed Care – PPO | Admitting: Family Medicine

## 2021-07-16 VITALS — BP 110/68 | HR 71 | Temp 98.7°F | Wt 143.6 lb

## 2021-07-16 DIAGNOSIS — R0989 Other specified symptoms and signs involving the circulatory and respiratory systems: Secondary | ICD-10-CM | POA: Diagnosis not present

## 2021-07-16 NOTE — Progress Notes (Signed)
Subjective:    Patient ID: [name] Bowman, female    DOB: 06/23/1975, 46 y.o.   MRN: [id number]  Chief Complaint  Patient presents with   OTHER    Pt c/o feeling something in throat. Going on for 2-3 days.     HPI Patient was seen today for acute concern.  Patient endorses feeling like a piece of cilantro parsley got stuck in the back of her throat while eating a few days ago.  Pt tried to reach in the back of mouth/throat to move the food but felt a "scab".  Pt then noticed a "pulling" sensation in throat with swallowing.  Pt denies sore throat, hoarseness, GERD, dysphagia.  Tried increasing fluids to see if symptoms improved.  Notes improvement in symptoms today.    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 Kenna Gilbert in 2009 with ECHO (benign), then saw Dr. Merrily Pew for exam (benign)   CONSTIPATION 05/16/2008   GERD 05/13/2008   with certain foods   Headache(784.0) 05/12/2007   History of exercise stress test 08/20/2013   normal    HYPERLIPIDEMIA 03/29/2007   on meds   INSOMNIA 03/29/2007   Patellar tendinitis 05/31/2008   Seasonal allergies     Allergies  Allergen Reactions   Sulfonamide Derivatives Rash    ROS General: Denies fever, chills, night sweats, changes in weight, changes in appetite HEENT: Denies headaches, ear pain, changes in vision, rhinorrhea, sore throat +sensation of food stuck in throat, pulling sensation in R side of throat CV: Denies CP, palpitations, SOB, orthopnea Pulm: Denies SOB, cough, wheezing GI: Denies abdominal pain, nausea, vomiting, diarrhea, constipation GU: Denies dysuria, hematuria, frequency, vaginal discharge Msk: Denies muscle cramps, joint pains Neuro: Denies weakness, numbness, tingling Skin: Denies rashes, bruising Psych: Denies depression, anxiety, hallucinations     Objective:    Blood pressure 110/68, pulse 71, temperature 98.7 F (37.1 C),  temperature source Oral, weight 143 lb 9.6 oz (65.1 kg), SpO2 99 %.  Gen. Pleasant, well-nourished, in no distress, normal affect   HEENT: [place]/AT, face symmetric, conjunctiva clear, no scleral icterus, PERRLA, EOMI, nares patent without drainage, normal oral mucosa, MMM pink, pharynx without erythema or exudate.  No abrasions,  food, or scabs noted. Lungs: no accessory muscle use, CTAB, no wheezes or rales Cardiovascular: RRR, no m/r/g, no peripheral edema Neuro:  A&Ox3, CN II-XII intact, normal gait Skin:  Warm, no lesions/ rash   Wt Readings from Last 3 Encounters:  07/16/21 143 lb 9.6 oz (65.1 kg)  02/09/21 170 lb 4 oz (77.2 kg)  10/29/20 168 lb (76.2 kg)    Lab Results  Component Value Date   WBC 5.6 02/09/2021   HGB 15.0 02/09/2021   HCT 44.3 02/09/2021   PLT 322.0 02/09/2021   GLUCOSE 87 02/09/2021   CHOL 220 (H) 02/09/2021   TRIG 145.0 02/09/2021   HDL 49.10 02/09/2021   LDLDIRECT 128.9 11/11/2010   LDLCALC 142 (H) 02/09/2021   ALT 13 02/09/2021   AST 14 02/09/2021   NA 138 02/09/2021   K 4.2 02/09/2021   CL 101 02/09/2021   CREATININE 1.08 02/09/2021   BUN 20 02/09/2021   CO2 28 02/09/2021   TSH 1.95 09/10/2020   INR 0.9 09/10/2020   HGBA1C 5.0 02/09/2021    Assessment/Plan:  Globus sensation -resolving -not food, foreign object, abrasion or ulcer noted on exam. -discussed ways to remove possible lodged food including drinking  milk, eating bread, and increasing fluids. -can gargle with warm salt water for discomfort. -given precautions  F/u prn  Abbe Amsterdam, MD

## 2021-07-17 ENCOUNTER — Ambulatory Visit: Payer: BC Managed Care – PPO | Admitting: Family Medicine

## 2021-08-29 DIAGNOSIS — M79672 Pain in left foot: Secondary | ICD-10-CM | POA: Diagnosis not present

## 2021-09-02 ENCOUNTER — Encounter: Payer: Self-pay | Admitting: Family Medicine

## 2021-09-02 ENCOUNTER — Ambulatory Visit: Payer: BC Managed Care – PPO | Admitting: Family Medicine

## 2021-09-02 VITALS — BP 98/78 | HR 71 | Temp 98.9°F | Wt 137.4 lb

## 2021-09-02 DIAGNOSIS — J020 Streptococcal pharyngitis: Secondary | ICD-10-CM

## 2021-09-02 DIAGNOSIS — J029 Acute pharyngitis, unspecified: Secondary | ICD-10-CM

## 2021-09-02 DIAGNOSIS — R519 Headache, unspecified: Secondary | ICD-10-CM | POA: Diagnosis not present

## 2021-09-02 LAB — POCT RAPID STREP A (OFFICE): Rapid Strep A Screen: POSITIVE — AB

## 2021-09-02 LAB — POC COVID19 BINAXNOW: SARS Coronavirus 2 Ag: NEGATIVE

## 2021-09-02 MED ORDER — CEFUROXIME AXETIL 500 MG PO TABS: 500.0000 mg | ORAL_TABLET | Freq: Two times a day (BID) | ORAL | 0 refills | Status: AC

## 2021-09-02 MED ORDER — ZOLPIDEM TARTRATE 10 MG PO TABS
10.0000 mg | ORAL_TABLET | Freq: Every evening | ORAL | 1 refills | Status: DC | PRN
Start: 2021-09-02 — End: 2022-04-12

## 2021-09-02 NOTE — Progress Notes (Signed)
   Subjective:    Patient ID: [name] Bowman, female    DOB: 30-Oct-1975, 46 y.o.   MRN: [id number]  HPI Here for 4 days of stuffy head, headaches, ST, and a dry cough. No fever or SOB or NVD. Drinking fluids and taking Nyqil.   Review of Systems  Constitutional: Negative.   HENT:  Positive for congestion, postnasal drip, sinus pressure and sore throat. Negative for ear pain.   Eyes: Negative.   Respiratory:  Positive for cough. Negative for shortness of breath and wheezing.        Objective:   Physical Exam Constitutional:      Appearance: Normal appearance.  HENT:     Right Ear: Tympanic membrane, ear canal and external ear normal.     Left Ear: Tympanic membrane, ear canal and external ear normal.     Nose: Nose normal.     Mouth/Throat:     Pharynx: Oropharynx is clear.  Eyes:     Conjunctiva/sclera: Conjunctivae normal.  Cardiovascular:     Rate and Rhythm: Normal rate and regular rhythm.     Pulses: Normal pulses.     Heart sounds: Normal heart sounds.  Pulmonary:     Effort: Pulmonary effort is normal.     Breath sounds: Normal breath sounds.  Lymphadenopathy:     Cervical: No cervical adenopathy.  Neurological:     Mental Status: She is alert.           Assessment & Plan:  Strep pharyngitis, treat with 10 days of Cefuroxime. Recheck as needed.  Gershon Crane, MD

## 2021-09-03 ENCOUNTER — Encounter: Payer: Self-pay | Admitting: Family Medicine

## 2021-09-03 NOTE — Telephone Encounter (Signed)
Please take care of this.  

## 2021-09-21 DIAGNOSIS — L814 Other melanin hyperpigmentation: Secondary | ICD-10-CM | POA: Diagnosis not present

## 2021-09-21 DIAGNOSIS — D225 Melanocytic nevi of trunk: Secondary | ICD-10-CM | POA: Diagnosis not present

## 2021-09-21 DIAGNOSIS — L218 Other seborrheic dermatitis: Secondary | ICD-10-CM | POA: Diagnosis not present

## 2021-09-21 DIAGNOSIS — L821 Other seborrheic keratosis: Secondary | ICD-10-CM | POA: Diagnosis not present

## 2021-09-24 DIAGNOSIS — M79609 Pain in unspecified limb: Secondary | ICD-10-CM | POA: Diagnosis not present

## 2021-10-12 ENCOUNTER — Ambulatory Visit: Payer: BC Managed Care – PPO | Admitting: Family Medicine

## 2021-10-12 ENCOUNTER — Encounter: Payer: Self-pay | Admitting: Family Medicine

## 2021-10-12 VITALS — BP 96/62 | HR 78 | Temp 98.1°F | Wt 135.0 lb

## 2021-10-12 DIAGNOSIS — J029 Acute pharyngitis, unspecified: Secondary | ICD-10-CM

## 2021-10-12 DIAGNOSIS — J020 Streptococcal pharyngitis: Secondary | ICD-10-CM

## 2021-10-12 LAB — POC COVID19 BINAXNOW: SARS Coronavirus 2 Ag: NEGATIVE

## 2021-10-12 LAB — POCT RAPID STREP A (OFFICE): Rapid Strep A Screen: POSITIVE — AB

## 2021-10-12 MED ORDER — CEFUROXIME AXETIL 500 MG PO TABS: 500.0000 mg | ORAL_TABLET | Freq: Two times a day (BID) | ORAL | 0 refills | Status: AC

## 2021-10-12 NOTE — Addendum Note (Signed)
Addended by: Wyvonne Lenz on: 10/12/2021 10:00 AM   Modules accepted: Orders

## 2021-10-12 NOTE — Progress Notes (Signed)
   Subjective:    Patient ID: [name] Bowman, female    DOB: [birth year], 46 y.o.   MRN: [id number]  HPI Here for 5 days of ST, dry cough, and hoarse voice. No fever or headache or body aches. Using Nyquil.    Review of Systems  Constitutional: Negative.   HENT:  Positive for sore throat and voice change. Negative for congestion, ear pain, postnasal drip and trouble swallowing.   Eyes: Negative.   Respiratory: Negative.    Gastrointestinal: Negative.        Objective:   Physical Exam Constitutional:      Appearance: Normal appearance.  HENT:     Right Ear: Tympanic membrane, ear canal and external ear normal.     Left Ear: Tympanic membrane, ear canal and external ear normal.     Nose: Nose normal.     Mouth/Throat:     Pharynx: Oropharynx is clear.  Eyes:     Conjunctiva/sclera: Conjunctivae normal.  Pulmonary:     Effort: Pulmonary effort is normal.     Breath sounds: Normal breath sounds.  Lymphadenopathy:     Cervical: No cervical adenopathy.  Neurological:     Mental Status: She is alert.           Assessment & Plan:  Strep pharyngitis, treat with 10 days of Cefuroxime. Written out of work 10-12-21 through 10-15-21.  Alysia Penna, MD

## 2021-11-06 ENCOUNTER — Encounter: Payer: Self-pay | Admitting: Family Medicine

## 2021-11-06 ENCOUNTER — Ambulatory Visit: Payer: BC Managed Care – PPO | Admitting: Family Medicine

## 2021-11-06 VITALS — BP 110/72 | HR 75 | Temp 98.1°F | Wt 137.0 lb

## 2021-11-06 DIAGNOSIS — L659 Nonscarring hair loss, unspecified: Secondary | ICD-10-CM

## 2021-11-06 LAB — LIPID PANEL
Cholesterol: 238 mg/dL — ABNORMAL HIGH (ref 0–200)
HDL: 59.9 mg/dL (ref >39.00)
LDL Cholesterol: 159 mg/dL — ABNORMAL HIGH (ref 0–99)
NonHDL: 177.6
Total CHOL/HDL Ratio: 4
Triglycerides: 91 mg/dL (ref 0.0–149.0)
VLDL: 18.2 mg/dL (ref 0.0–40.0)

## 2021-11-06 LAB — CBC WITH DIFFERENTIAL/PLATELET
Basophils Absolute: 0.1 10*3/uL (ref 0.0–0.1)
Basophils Relative: 1.2 % (ref 0.0–3.0)
Eosinophils Absolute: 0.2 10*3/uL (ref 0.0–0.7)
Eosinophils Relative: 3.2 % (ref 0.0–5.0)
HCT: 42 % (ref 36.0–46.0)
Hemoglobin: 14.2 g/dL (ref 12.0–15.0)
Lymphocytes Relative: 51 % — ABNORMAL HIGH (ref 12.0–46.0)
Lymphs Abs: 2.6 10*3/uL (ref 0.7–4.0)
MCHC: 33.8 g/dL (ref 30.0–36.0)
MCV: 94.8 fl (ref 78.0–100.0)
Monocytes Absolute: 0.3 10*3/uL (ref 0.1–1.0)
Monocytes Relative: 5.9 % (ref 3.0–12.0)
Neutro Abs: 2 10*3/uL (ref 1.4–7.7)
Neutrophils Relative %: 38.7 % — ABNORMAL LOW (ref 43.0–77.0)
Platelets: 299 10*3/uL (ref 150.0–400.0)
RBC: 4.43 Mil/uL (ref 3.87–5.11)
RDW: 12.5 % (ref 11.5–15.5)
WBC: 5.1 10*3/uL (ref 4.0–10.5)

## 2021-11-06 LAB — HEPATIC FUNCTION PANEL
ALT: 10 U/L (ref 0–35)
AST: 13 U/L (ref 0–37)
Albumin: 4.5 g/dL (ref 3.5–5.2)
Alkaline Phosphatase: 40 U/L (ref 39–117)
Bilirubin, Direct: 0.1 mg/dL (ref 0.0–0.3)
Total Bilirubin: 0.5 mg/dL (ref 0.2–1.2)
Total Protein: 7 g/dL (ref 6.0–8.3)

## 2021-11-06 LAB — BASIC METABOLIC PANEL
BUN: 15 mg/dL (ref 6–23)
CO2: 30 mEq/L (ref 19–32)
Calcium: 9.9 mg/dL (ref 8.4–10.5)
Chloride: 106 mEq/L (ref 96–112)
Creatinine, Ser: 0.9 mg/dL (ref 0.40–1.20)
GFR: 75.71 mL/min (ref >60.00)
Glucose, Bld: 104 mg/dL — ABNORMAL HIGH (ref 70–99)
Potassium: 5.6 mEq/L — ABNORMAL HIGH (ref 3.5–5.1)
Sodium: 144 mEq/L (ref 135–145)

## 2021-11-06 LAB — TESTOSTERONE: Testosterone: 48.16 ng/dL — ABNORMAL HIGH (ref 15.00–40.00)

## 2021-11-06 LAB — VITAMIN B12: Vitamin B-12: 440 pg/mL (ref 211–911)

## 2021-11-06 LAB — T4, FREE: Free T4: 0.99 ng/dL (ref 0.60–1.60)

## 2021-11-06 LAB — TSH: TSH: 0.57 u[IU]/mL (ref 0.35–5.50)

## 2021-11-06 LAB — HEMOGLOBIN A1C: Hgb A1c MFr Bld: 5.3 % (ref 4.6–6.5)

## 2021-11-06 LAB — T3, FREE: T3, Free: 3.4 pg/mL (ref 2.3–4.2)

## 2021-11-06 NOTE — Progress Notes (Signed)
   Subjective:    Patient ID: [name] Bowman, female    DOB: Jun 02, 1975, 46 y.o.   MRN: [id number]  HPI Here to discuss hair loss. She first noticed her hair thinning at the edges and coming out when she brushed it about 2 months ago. She feels fine otherwise. She had been going to the [facility] for the past 4 years, and she had been using implantable pellets with testosterone and progesterone in them for about 3 years. She started this originally to help with hot flashes. She then decided to stop the pellets this past May. Then she was started on [employer] thyroid about 3 weeks ago. She continues to have hot flashes, but she has not had a menstrual cycle since May. She has lost 33 lbs in the last year, but this was intentional.    Review of Systems  Constitutional: Negative.   Respiratory: Negative.    Cardiovascular: Negative.   Gastrointestinal: Negative.   Endocrine: Negative.   Genitourinary: Negative.        Objective:   Physical Exam Constitutional:      Appearance: Normal appearance.  Cardiovascular:     Rate and Rhythm: Normal rate and regular rhythm.     Pulses: Normal pulses.     Heart sounds: Normal heart sounds.  Pulmonary:     Effort: Pulmonary effort is normal.     Breath sounds: Normal breath sounds.  Neurological:     Mental Status: She is alert.           Assessment & Plan:  Hair loss. The most likely explanation is the frequent hormonal shifts her body has been going though in response to the medication changes she has been making. We will check labs today including a full thyroid panel and hormone levels. I advised her that once she stays on a medication regimen long enough for her body to stabilize, her hair should start growing back in.  Alysia Penna, MD

## 2021-11-07 LAB — ESTRADIOL: Estradiol: 21 pg/mL

## 2021-11-07 LAB — PROGESTERONE: Progesterone: 23.1 ng/mL

## 2021-12-14 DIAGNOSIS — Z803 Family history of malignant neoplasm of breast: Secondary | ICD-10-CM | POA: Diagnosis not present

## 2021-12-14 DIAGNOSIS — Z780 Asymptomatic menopausal state: Secondary | ICD-10-CM | POA: Diagnosis not present

## 2021-12-15 ENCOUNTER — Ambulatory Visit: Payer: BC Managed Care – PPO | Admitting: Family Medicine

## 2021-12-15 ENCOUNTER — Encounter: Payer: Self-pay | Admitting: Family Medicine

## 2021-12-15 VITALS — BP 98/72 | HR 75 | Temp 98.2°F | Wt 139.0 lb

## 2021-12-15 DIAGNOSIS — J358 Other chronic diseases of tonsils and adenoids: Secondary | ICD-10-CM | POA: Diagnosis not present

## 2021-12-15 DIAGNOSIS — E559 Vitamin D deficiency, unspecified: Secondary | ICD-10-CM | POA: Diagnosis not present

## 2021-12-15 DIAGNOSIS — D649 Anemia, unspecified: Secondary | ICD-10-CM

## 2021-12-15 DIAGNOSIS — E538 Deficiency of other specified B group vitamins: Secondary | ICD-10-CM | POA: Diagnosis not present

## 2021-12-15 DIAGNOSIS — E079 Disorder of thyroid, unspecified: Secondary | ICD-10-CM

## 2021-12-15 LAB — CBC WITH DIFFERENTIAL/PLATELET
Basophils Absolute: 0 10*3/uL (ref 0.0–0.1)
Basophils Relative: 0.9 % (ref 0.0–3.0)
Eosinophils Absolute: 0.1 10*3/uL (ref 0.0–0.7)
Eosinophils Relative: 2.6 % (ref 0.0–5.0)
HCT: 41.9 % (ref 36.0–46.0)
Hemoglobin: 14.3 g/dL (ref 12.0–15.0)
Lymphocytes Relative: 47 % — ABNORMAL HIGH (ref 12.0–46.0)
Lymphs Abs: 2.5 10*3/uL (ref 0.7–4.0)
MCHC: 34.1 g/dL (ref 30.0–36.0)
MCV: 94.4 fl (ref 78.0–100.0)
Monocytes Absolute: 0.3 10*3/uL (ref 0.1–1.0)
Monocytes Relative: 6.2 % (ref 3.0–12.0)
Neutro Abs: 2.3 10*3/uL (ref 1.4–7.7)
Neutrophils Relative %: 43.3 % (ref 43.0–77.0)
Platelets: 352 10*3/uL (ref 150.0–400.0)
RBC: 4.44 Mil/uL (ref 3.87–5.11)
RDW: 12.8 % (ref 11.5–15.5)
WBC: 5.3 10*3/uL (ref 4.0–10.5)

## 2021-12-15 LAB — IBC + FERRITIN
Ferritin: 66.6 ng/mL (ref 10.0–291.0)
Iron: 129 ug/dL (ref 42–145)
Saturation Ratios: 37.6 % (ref 20.0–50.0)
TIBC: 343 ug/dL (ref 250.0–450.0)
Transferrin: 245 mg/dL (ref 212.0–360.0)

## 2021-12-15 LAB — VITAMIN D 25 HYDROXY (VIT D DEFICIENCY, FRACTURES): VITD: 66.89 ng/mL (ref 30.00–100.00)

## 2021-12-15 LAB — VITAMIN B12: Vitamin B-12: 305 pg/mL (ref 211–911)

## 2021-12-15 NOTE — Progress Notes (Signed)
Subjective:    Patient ID: [name] Bowman, female    DOB: Apr 29, 1975, 46 y.o.   MRN: [id number]  HPI Here for several issues. First she saw a white spot on her right tonsil last night and it alarmed her. She took a Clinical research associate of it with her cell phone. It was not painful. This morning the spot is gone. Also she had been seeing an alternative medicine doctor and she was being treated with testosterone and estrogen. She recently saw a breast acncer specialist at [place] (due to her family hx of this) and she stopped all the hormone treatments. She is also taking [place] thyroid from the alternative clinic, and she asks if she should keep taking this. She does not plan to see the alternative clinic any more. E checked a full thyroid panel in October, and this was normal. Lastly the GYN she met asked her to get some "nutrition labs" so she wants to do this today.    Review of Systems  Constitutional: Negative.   HENT:  Negative for sore throat, trouble swallowing and voice change.   Respiratory: Negative.    Cardiovascular: Negative.        Objective:   Physical Exam Constitutional:      Appearance: Normal appearance.  HENT:     Mouth/Throat:     Pharynx: Oropharynx is clear.     Comments: The right tonsil is larger than the left and it has large crypts. No stones are seen. I did look at the photos she took last night, and indeed there was a tonsil stone  Cardiovascular:     Rate and Rhythm: Normal rate and regular rhythm.     Pulses: Normal pulses.     Heart sounds: Normal heart sounds.  Pulmonary:     Effort: Pulmonary effort is normal.     Breath sounds: Normal breath sounds.  Lymphadenopathy:     Cervical: No cervical adenopathy.  Neurological:     Mental Status: She is alert.           Assessment & Plan:  First she had a tonsil stone, and it has resolved I reassured her these are common and benign. As far as her thyroid medication, we will stop this and will check a thyroid  panel in 3 months. As for nutrition labs. Today we will check for anemia, B12, vitamin D etc.  Alysia Penna, MD

## 2022-01-06 ENCOUNTER — Ambulatory Visit: Payer: BC Managed Care – PPO | Admitting: Family Medicine

## 2022-01-26 DIAGNOSIS — Z23 Encounter for immunization: Secondary | ICD-10-CM | POA: Diagnosis not present

## 2022-01-26 DIAGNOSIS — Z01419 Encounter for gynecological examination (general) (routine) without abnormal findings: Secondary | ICD-10-CM | POA: Diagnosis not present

## 2022-02-11 DIAGNOSIS — M25521 Pain in right elbow: Secondary | ICD-10-CM | POA: Diagnosis not present

## 2022-02-23 DIAGNOSIS — M7711 Lateral epicondylitis, right elbow: Secondary | ICD-10-CM | POA: Diagnosis not present

## 2022-03-01 DIAGNOSIS — M25521 Pain in right elbow: Secondary | ICD-10-CM | POA: Diagnosis not present

## 2022-03-13 DIAGNOSIS — K529 Noninfective gastroenteritis and colitis, unspecified: Secondary | ICD-10-CM | POA: Diagnosis not present

## 2022-04-12 ENCOUNTER — Encounter: Payer: Self-pay | Admitting: Family Medicine

## 2022-04-12 MED ORDER — ZOLPIDEM TARTRATE 10 MG PO TABS: 10.0000 mg | ORAL_TABLET | Freq: Every evening | ORAL | 1 refills | Status: DC | PRN

## 2022-04-12 NOTE — Telephone Encounter (Signed)
Done

## 2022-04-12 NOTE — Telephone Encounter (Signed)
The form is ready  

## 2022-04-15 NOTE — Telephone Encounter (Signed)
Pt Form was faxed as requested, copy sent to scanning

## 2022-05-26 DIAGNOSIS — M722 Plantar fascial fibromatosis: Secondary | ICD-10-CM | POA: Diagnosis not present

## 2022-08-26 ENCOUNTER — Telehealth: Payer: Self-pay | Admitting: Family Medicine

## 2022-08-26 ENCOUNTER — Encounter: Payer: Self-pay | Admitting: Family Medicine

## 2022-08-26 ENCOUNTER — Ambulatory Visit: Payer: BC Managed Care – PPO | Admitting: Family Medicine

## 2022-08-26 VITALS — BP 98/68 | HR 96 | Temp 98.2°F | Wt 144.8 lb

## 2022-08-26 DIAGNOSIS — R7989 Other specified abnormal findings of blood chemistry: Secondary | ICD-10-CM | POA: Diagnosis not present

## 2022-08-26 DIAGNOSIS — E0590 Thyrotoxicosis, unspecified without thyrotoxic crisis or storm: Secondary | ICD-10-CM

## 2022-08-26 DIAGNOSIS — M545 Low back pain, unspecified: Secondary | ICD-10-CM | POA: Diagnosis not present

## 2022-08-26 LAB — TSH: TSH: <0.01 u[IU]/mL — ABNORMAL LOW (ref 0.35–5.50)

## 2022-08-26 LAB — T3, FREE: T3, Free: 7 pg/mL — ABNORMAL HIGH (ref 2.3–4.2)

## 2022-08-26 LAB — T4, FREE: Free T4: 2.28 ng/dL — ABNORMAL HIGH (ref 0.60–1.60)

## 2022-08-26 NOTE — Progress Notes (Signed)
   Subjective:    Patient ID: [name] Bowman, female    DOB: [birth year], 47 y.o.   MRN: [id number]  HPI Here for a flare up of low back pain. Since her surgery, her lower back will get stiff and painful if she "over does it". Last weekend she did a lot of yardwork and the pain flared up. She has been resting and taking Ibuprofen, and the pain is slowly going away.    Review of Systems  Constitutional: Negative.   Respiratory: Negative.    Cardiovascular: Negative.   Musculoskeletal:  Positive for back pain.       Objective:   Physical Exam Constitutional:      General: She is not in acute distress.    Appearance: Normal appearance.  Cardiovascular:     Rate and Rhythm: Normal rate and regular rhythm.     Pulses: Normal pulses.     Heart sounds: Normal heart sounds.  Pulmonary:     Effort: Pulmonary effort is normal.     Breath sounds: Normal breath sounds.  Musculoskeletal:     Comments: Lower back is not tender ans has full ROM  Neurological:     Mental Status: She is alert.           Assessment & Plan:  Low back pain. This should resolve in a few days. We wrote her out of work from 08-24-22 through 08-29-22.  Gershon Crane, MD

## 2022-08-26 NOTE — Addendum Note (Signed)
Addended by: Gershon Crane A on: 08/26/2022 01:45 PM   Modules accepted: Orders

## 2022-08-26 NOTE — Telephone Encounter (Signed)
Pt was just seen this a.m. and she needs your help urgently regarding her Dr's note   She says there is a form that needs to be uploaded to her MyChart (and you probably already know about it)  Pt says it is urgent.

## 2022-08-27 NOTE — Telephone Encounter (Signed)
The form is ready  

## 2022-09-16 DIAGNOSIS — Z1231 Encounter for screening mammogram for malignant neoplasm of breast: Secondary | ICD-10-CM | POA: Diagnosis not present

## 2022-09-27 DIAGNOSIS — L821 Other seborrheic keratosis: Secondary | ICD-10-CM | POA: Diagnosis not present

## 2022-09-27 DIAGNOSIS — D225 Melanocytic nevi of trunk: Secondary | ICD-10-CM | POA: Diagnosis not present

## 2022-09-27 DIAGNOSIS — L814 Other melanin hyperpigmentation: Secondary | ICD-10-CM | POA: Diagnosis not present

## 2022-10-14 ENCOUNTER — Other Ambulatory Visit: Payer: Self-pay | Admitting: Family Medicine

## 2022-10-19 NOTE — Telephone Encounter (Signed)
Pt LOV was on 08/26/22 Last refill was done on 04/12/22 Please advise

## 2022-12-14 DIAGNOSIS — Z1231 Encounter for screening mammogram for malignant neoplasm of breast: Secondary | ICD-10-CM | POA: Diagnosis not present

## 2022-12-30 DIAGNOSIS — L570 Actinic keratosis: Secondary | ICD-10-CM | POA: Diagnosis not present

## 2022-12-31 DIAGNOSIS — R928 Other abnormal and inconclusive findings on diagnostic imaging of breast: Secondary | ICD-10-CM | POA: Diagnosis not present

## 2023-01-03 ENCOUNTER — Ambulatory Visit
Admission: RE | Admit: 2023-01-03 | Discharge: 2023-01-03 | Disposition: A | Discharge status: Home / Self Care | Payer: BC Managed Care – PPO | Source: Ambulatory Visit

## 2023-01-03 VITALS — BP 132/81 | HR 91 | Temp 99.1°F | Resp 16

## 2023-01-03 DIAGNOSIS — J069 Acute upper respiratory infection, unspecified: Secondary | ICD-10-CM | POA: Diagnosis not present

## 2023-01-03 DIAGNOSIS — R6889 Other general symptoms and signs: Secondary | ICD-10-CM | POA: Diagnosis not present

## 2023-01-03 DIAGNOSIS — R519 Headache, unspecified: Secondary | ICD-10-CM

## 2023-01-03 LAB — POC COVID19/FLU A&B COMBO
Covid Antigen, POC: NEGATIVE
Influenza A Antigen, POC: NEGATIVE
Influenza B Antigen, POC: NEGATIVE

## 2023-01-03 MED ORDER — PREDNISONE 20 MG PO TABS: 20.0000 mg | ORAL_TABLET | Freq: Every day | ORAL | 0 refills | Status: AC

## 2023-01-03 NOTE — ED Provider Notes (Signed)
[facility]    CSN: [id number] Arrival date & time: 01/03/23  1423      History   Chief Complaint Chief Complaint  Patient presents with   Headache    Also have some chills & congestion. - Entered by patient    HPI [name] Bowman is a 47 y.o. female.   Patient here with a 3 day history of headache, congestion, body aches and chills. 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 She has taken ibuprofen for fever. Denies shortness of breath, wheezing, or chest tightness.  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 Kenna Gilbert in 2009 with ECHO (benign), then saw Dr. Merrily Pew for exam (benign)   CONSTIPATION 05/16/2008   GERD 05/13/2008   with certain foods   Headache(784.0) 05/12/2007   History of exercise stress test 08/20/2013   normal    HYPERLIPIDEMIA 03/29/2007   on meds   INSOMNIA 03/29/2007   Patellar tendinitis 05/31/2008   Seasonal allergies     Patient Active Problem List   Diagnosis Date Noted   HNP (herniated nucleus pulposus), lumbar 08/01/2015   Spinal stenosis of lumbar region 08/01/2015   Constipation 05/16/2008   GERD 05/13/2008   Headache 05/12/2007   Hyperlipidemia 03/29/2007   ANEMIA-NOS 03/29/2007   INSOMNIA 03/29/2007    Past Surgical History:  Procedure Laterality Date   HAMMER TOE SURGERY  01/11/2010   right 4th and 5th toes   LUMBAR LAMINECTOMY/DECOMPRESSION MICRODISCECTOMY Right 08/01/2015   Procedure: MICRO LUMBAR DECOMPRESSION L5-S1 RIGHT (1 LEVEL);  Surgeon: Jene Every, MD;  Location: [facility];  Service: Orthopedics;  Laterality: Right;   mole removed from tear duct as a child      UPPER GASTROINTESTINAL ENDOSCOPY  2010   WISDOM TOOTH EXTRACTION      OB History   No obstetric history on file.      Home Medications    Prior to Admission medications   Medication Sig Start Date End Date Taking? Authorizing Provider   estradiol (VIVELLE-DOT) 0.05 MG/24HR patch Place 1 patch onto the skin 2 (two) times a week.   Yes [provider]  fluorouracil (EFUDEX) 5 % cream Apply topically. 12/30/22  Yes [provider]  predniSONE (DELTASONE) 20 MG tablet Take 1 tablet (20 mg total) by mouth daily with breakfast for 5 days. 01/03/23 01/08/23 Yes Bing Neighbors, NP  progesterone (PROMETRIUM) 100 MG capsule Take 100 mg by mouth at bedtime. 06/20/22  Yes [provider]  zolpidem (AMBIEN) 10 MG tablet TAKE 1 TABLET (10 MG TOTAL) BY MOUTH AT BEDTIME AS NEEDED. FOR SLEEP 10/19/22  Yes Nelwyn Salisbury, MD    Family History Family History  Problem Relation Age of Onset   Breast cancer Mother    Colon polyps Father    Diabetes Father    Heart disease Father 18   Breast cancer Maternal Aunt    Ovarian cancer Maternal Aunt    Breast cancer Maternal Grandmother    Colon cancer Neg Hx    Esophageal cancer Neg Hx    Rectal cancer Neg Hx    Stomach cancer Neg Hx     Social History Social History   Tobacco Use   Smoking status: Never   Smokeless tobacco: Never  Vaping Use   Vaping status: Never Used  Substance Use Topics   Alcohol use: Yes    Alcohol/week: 1.0  standard drink of alcohol    Types: 1 Standard drinks or equivalent per week    Comment: 1-2 glassses of wine or beer per week    Drug use: No     Allergies   Sulfonamide derivatives   Review of Systems Review of Systems  Constitutional:  Positive for fatigue. Negative for fever.  HENT:  Positive for congestion, postnasal drip, rhinorrhea, sinus pressure and sinus pain. Negative for sore throat.   Musculoskeletal:  Positive for myalgias.  Neurological:  Positive for headaches.     Physical Exam Triage Vital Signs ED Triage Vitals  Encounter Vitals Group     BP 01/03/23 1450 132/81     Systolic BP Percentile --      Diastolic BP Percentile --      Pulse Rate 01/03/23 1450 91     Resp 01/03/23 1450 16     Temp  01/03/23 1450 99.1 F (37.3 C)     Temp Source 01/03/23 1450 Oral     SpO2 01/03/23 1450 98 %     Weight --      Height --      Head Circumference --      Peak Flow --      Pain Score 01/03/23 1446 0     Pain Loc --      Pain Education --      Exclude from Growth Chart --    No data found.  Updated Vital Signs BP 132/81 (BP Location: Left Arm)   Pulse 91   Temp 99.1 F (37.3 C) (Oral)   Resp 16   SpO2 98%   Visual Acuity Right Eye Distance:   Left Eye Distance:   Bilateral Distance:    Right Eye Near:   Left Eye Near:    Bilateral Near:     Physical Exam Constitutional:      Appearance: She is well-developed. She is not ill-appearing.  HENT:     Head: Normocephalic and atraumatic.     Nose: Congestion and rhinorrhea present.  Eyes:     Extraocular Movements: Extraocular movements intact.     Pupils: Pupils are equal, round, and reactive to light.  Cardiovascular:     Rate and Rhythm: Normal rate and regular rhythm.  Pulmonary:     Effort: Pulmonary effort is normal.     Breath sounds: Normal breath sounds.  Musculoskeletal:     Cervical back: Normal range of motion and neck supple.  Skin:    General: Skin is warm and dry.  Neurological:     Mental Status: She is alert and oriented to person, place, and time.      UC Treatments / Results  Labs (all labs ordered are listed, but only abnormal results are displayed) Labs Reviewed  POC COVID19/FLU A&B COMBO - Normal    EKG   Radiology No results found.  Procedures Procedures (including critical care time)  Medications Ordered in UC Medications - No data to display  Initial Impression / Assessment and Plan / UC Course  I have reviewed the triage vital signs and the nursing notes.  Pertinent labs & imaging results that were available during my care of the patient were reviewed by me and considered in my medical decision making (see chart for details).    Viral URI with flu-like symptoms. POCT  COVID/FLU negative.  Agreed to a short burst of prednisone for sinus headache and sinus pressure.  Patient encouraged to force fluids and and rest.  Completed  return to work form supplied by patient and scanned to EMR.  Return precautions given. Final Clinical Impressions(s) / UC Diagnoses   Final diagnoses:  Viral URI  Flu-like symptoms  Sinus headache     Discharge Instructions      Symptoms appear to be viral. Recommend symptoms management with OTC medication (Naproxen or Ibuprofen) for headache and body aches.     ED Prescriptions     Medication Sig Dispense Auth. Provider   predniSONE (DELTASONE) 20 MG tablet Take 1 tablet (20 mg total) by mouth daily with breakfast for 5 days. 5 tablet Bing Neighbors, NP      PDMP not reviewed this encounter.   Bing Neighbors, NP 01/03/23 1726

## 2023-01-03 NOTE — Discharge Instructions (Addendum)
Symptoms appear to be viral. Recommend symptoms management with OTC medication (Naproxen or Ibuprofen) for headache and body aches.

## 2023-01-03 NOTE — ED Triage Notes (Signed)
Pt presents to UC for c/o sinus drainage and pressure, watery eyes, body aches, headaches x3 days. Taking advil

## 2023-01-31 DIAGNOSIS — H00011 Hordeolum externum right upper eyelid: Secondary | ICD-10-CM | POA: Diagnosis not present

## 2023-04-02 DIAGNOSIS — M545 Low back pain, unspecified: Secondary | ICD-10-CM | POA: Diagnosis not present

## 2023-08-04 ENCOUNTER — Encounter: Admitting: Family Medicine

## 2023-08-10 ENCOUNTER — Ambulatory Visit (INDEPENDENT_AMBULATORY_CARE_PROVIDER_SITE_OTHER): Admitting: Family Medicine

## 2023-08-10 VITALS — BP 110/76 | HR 87 | Temp 98.4°F | Ht 64.5 in | Wt 156.2 lb

## 2023-08-10 DIAGNOSIS — Z13220 Encounter for screening for lipoid disorders: Secondary | ICD-10-CM | POA: Diagnosis not present

## 2023-08-10 DIAGNOSIS — Z131 Encounter for screening for diabetes mellitus: Secondary | ICD-10-CM | POA: Diagnosis not present

## 2023-08-10 DIAGNOSIS — Z0000 Encounter for general adult medical examination without abnormal findings: Secondary | ICD-10-CM

## 2023-08-10 DIAGNOSIS — Z23 Encounter for immunization: Secondary | ICD-10-CM | POA: Diagnosis not present

## 2023-08-10 DIAGNOSIS — I781 Nevus, non-neoplastic: Secondary | ICD-10-CM | POA: Insufficient documentation

## 2023-08-10 MED ORDER — ZOLPIDEM TARTRATE 10 MG PO TABS: 10.0000 mg | ORAL_TABLET | Freq: Every evening | ORAL | 1 refills | Status: AC | PRN

## 2023-08-10 NOTE — Addendum Note (Signed)
 Addended by: LADONNA INOCENTE SAILOR on: 08/10/2023 03:33 PM   Modules accepted: Orders

## 2023-08-10 NOTE — Progress Notes (Signed)
   Subjective:    Patient ID: [name] Bowman, female    DOB: 02/11/1975, 48 y.o.   MRN: [id number]  HPI Here for a well exam. She feels fine except for some patches of painful dilated veins on her legs.    Review of Systems  Constitutional: Negative.   HENT: Negative.    Eyes: Negative.   Respiratory: Negative.    Cardiovascular: Negative.   Gastrointestinal: Negative.   Genitourinary:  Negative for decreased urine volume, difficulty urinating, dyspareunia, dysuria, enuresis, flank pain, frequency, hematuria, pelvic pain and urgency.  Musculoskeletal: Negative.   Skin: Negative.   Neurological: Negative.  Negative for headaches.  Psychiatric/Behavioral: Negative.         Objective:   Physical Exam Constitutional:      General: She is not in acute distress.    Appearance: Normal appearance. She is well-developed.  HENT:     Head: Normocephalic and atraumatic.     Right Ear: External ear normal.     Left Ear: External ear normal.     Nose: Nose normal.     Mouth/Throat:     Pharynx: No oropharyngeal exudate.  Eyes:     General: No scleral icterus.    Conjunctiva/sclera: Conjunctivae normal.     Pupils: Pupils are equal, round, and reactive to light.  Neck:     Thyroid : No thyromegaly.     Vascular: No JVD.  Cardiovascular:     Rate and Rhythm: Normal rate and regular rhythm.     Pulses: Normal pulses.     Heart sounds: Normal heart sounds. No murmur heard.    No friction rub. No gallop.  Pulmonary:     Effort: Pulmonary effort is normal. No respiratory distress.     Breath sounds: Normal breath sounds. No wheezing or rales.  Chest:     Chest wall: No tenderness.  Abdominal:     General: Bowel sounds are normal. There is no distension.     Palpations: Abdomen is soft. There is no mass.     Tenderness: There is no abdominal tenderness. There is no guarding or rebound.  Musculoskeletal:        General: No tenderness. Normal range of motion.     Cervical back:  Normal range of motion and neck supple.  Lymphadenopathy:     Cervical: No cervical adenopathy.  Skin:    General: Skin is warm and dry.     Comments: There are small patches of telangectasias on both legs   Neurological:     General: No focal deficit present.     Mental Status: She is alert and oriented to person, place, and time.     Cranial Nerves: No cranial nerve deficit.     Motor: No abnormal muscle tone.     Coordination: Coordination normal.     Deep Tendon Reflexes: Reflexes are normal and symmetric. Reflexes normal.  Psychiatric:        Mood and Affect: Mood normal.        Behavior: Behavior normal.        Thought Content: Thought content normal.        Judgment: Judgment normal.           Assessment & Plan:  Well exam. We discussed diet and exercise. Get fasting labs. Refer to the [facility] to treat the telangectasis.  Garnette Olmsted, MD

## 2023-08-11 ENCOUNTER — Ambulatory Visit: Payer: Self-pay | Admitting: Family Medicine

## 2023-08-11 LAB — CBC WITH DIFFERENTIAL/PLATELET
Basophils Absolute: 0 K/uL (ref 0.0–0.1)
Basophils Relative: 0.3 % (ref 0.0–3.0)
Eosinophils Absolute: 0.1 K/uL (ref 0.0–0.7)
Eosinophils Relative: 1.8 % (ref 0.0–5.0)
HCT: 41.7 % (ref 36.0–46.0)
Hemoglobin: 13.9 g/dL (ref 12.0–15.0)
Lymphocytes Relative: 41.6 % (ref 12.0–46.0)
Lymphs Abs: 2.8 K/uL (ref 0.7–4.0)
MCHC: 33.4 g/dL (ref 30.0–36.0)
MCV: 89 fl (ref 78.0–100.0)
Monocytes Absolute: 0.4 K/uL (ref 0.1–1.0)
Monocytes Relative: 6.6 % (ref 3.0–12.0)
Neutro Abs: 3.3 K/uL (ref 1.4–7.7)
Neutrophils Relative %: 49.7 % (ref 43.0–77.0)
Platelets: 332 K/uL (ref 150.0–400.0)
RBC: 4.69 Mil/uL (ref 3.87–5.11)
RDW: 12.4 % (ref 11.5–15.5)
WBC: 6.6 K/uL (ref 4.0–10.5)

## 2023-08-11 LAB — HEPATIC FUNCTION PANEL
ALT: 14 U/L (ref 0–35)
AST: 14 U/L (ref 0–37)
Albumin: 4.3 g/dL (ref 3.5–5.2)
Alkaline Phosphatase: 43 U/L (ref 39–117)
Bilirubin, Direct: 0.1 mg/dL (ref 0.0–0.3)
Total Bilirubin: 0.4 mg/dL (ref 0.2–1.2)
Total Protein: 6.6 g/dL (ref 6.0–8.3)

## 2023-08-11 LAB — T3, FREE: T3, Free: 4.6 pg/mL — ABNORMAL HIGH (ref 2.3–4.2)

## 2023-08-11 LAB — BASIC METABOLIC PANEL WITH GFR
BUN: 14 mg/dL (ref 6–23)
CO2: 27 meq/L (ref 19–32)
Calcium: 9.3 mg/dL (ref 8.4–10.5)
Chloride: 104 meq/L (ref 96–112)
Creatinine, Ser: 0.87 mg/dL (ref 0.40–1.20)
GFR: 77.88 mL/min (ref >60.00)
Glucose, Bld: 74 mg/dL (ref 70–99)
Potassium: 3.8 meq/L (ref 3.5–5.1)
Sodium: 140 meq/L (ref 135–145)

## 2023-08-11 LAB — T4, FREE: Free T4: 1.41 ng/dL (ref 0.60–1.60)

## 2023-08-11 LAB — LIPID PANEL
Cholesterol: 207 mg/dL — ABNORMAL HIGH (ref 0–200)
HDL: 61.3 mg/dL (ref >39.00)
LDL Cholesterol: 120 mg/dL — ABNORMAL HIGH (ref 0–99)
NonHDL: 145.83
Total CHOL/HDL Ratio: 3
Triglycerides: 129 mg/dL (ref 0.0–149.0)
VLDL: 25.8 mg/dL (ref 0.0–40.0)

## 2023-08-11 LAB — HEMOGLOBIN A1C: Hgb A1c MFr Bld: 5.3 % (ref 4.6–6.5)

## 2023-08-11 LAB — TSH: TSH: 0 u[IU]/mL — ABNORMAL LOW (ref 0.35–5.50)

## 2023-08-11 LAB — C-REACTIVE PROTEIN: CRP: <1 mg/dL (ref 0.5–20.0)

## 2023-08-15 DIAGNOSIS — N951 Menopausal and female climacteric states: Secondary | ICD-10-CM | POA: Diagnosis not present

## 2023-08-15 DIAGNOSIS — Z1231 Encounter for screening mammogram for malignant neoplasm of breast: Secondary | ICD-10-CM | POA: Diagnosis not present

## 2023-08-15 DIAGNOSIS — Z79890 Hormone replacement therapy (postmenopausal): Secondary | ICD-10-CM | POA: Diagnosis not present

## 2023-08-15 DIAGNOSIS — Z01419 Encounter for gynecological examination (general) (routine) without abnormal findings: Secondary | ICD-10-CM | POA: Diagnosis not present

## 2023-08-17 ENCOUNTER — Telehealth: Payer: Self-pay

## 2023-08-17 NOTE — Telephone Encounter (Signed)
 I understand. She should NOT see the Endocrinologist yet. She should taking ANY medication that has biotin in it. Then after waiting 2 weeks, we can repeat a TSH, free T3, and free T4

## 2023-08-17 NOTE — Telephone Encounter (Signed)
 Copied from CRM [id number]. Topic: Clinical - Lab/Test Results >> Aug 15, 2023  8:21 AM Suzen RAMAN wrote: Reason for CRM: Patient return call and has been made aware of provider notations. Patient states she has not seen endocrinologist because she was taking a really high dose of biotin and know that alone can throw of a thyroid  test.Patient states she no longer takes the stand alone biotin but her daily vitamins does have biotin in. Patient would like to know if she needs blood work re-drawn or if she needs to still go to them since she identified the cause.  CB#[phone]

## 2023-08-30 DIAGNOSIS — I87393 Chronic venous hypertension (idiopathic) with other complications of bilateral lower extremity: Secondary | ICD-10-CM | POA: Diagnosis not present

## 2023-08-30 DIAGNOSIS — M79605 Pain in left leg: Secondary | ICD-10-CM | POA: Diagnosis not present

## 2023-08-30 DIAGNOSIS — M79661 Pain in right lower leg: Secondary | ICD-10-CM | POA: Diagnosis not present

## 2023-08-30 DIAGNOSIS — M79662 Pain in left lower leg: Secondary | ICD-10-CM | POA: Diagnosis not present

## 2023-08-30 DIAGNOSIS — M79604 Pain in right leg: Secondary | ICD-10-CM | POA: Diagnosis not present

## 2023-09-16 ENCOUNTER — Encounter: Payer: Self-pay | Admitting: Family Medicine

## 2023-10-03 DIAGNOSIS — L814 Other melanin hyperpigmentation: Secondary | ICD-10-CM | POA: Diagnosis not present

## 2023-10-03 DIAGNOSIS — L578 Other skin changes due to chronic exposure to nonionizing radiation: Secondary | ICD-10-CM | POA: Diagnosis not present

## 2023-10-03 DIAGNOSIS — D229 Melanocytic nevi, unspecified: Secondary | ICD-10-CM | POA: Diagnosis not present

## 2024-01-25 ENCOUNTER — Ambulatory Visit: Admitting: Family Medicine

## 2024-01-25 VITALS — BP 102/64 | HR 124 | Temp 98.6°F | Wt 138.2 lb

## 2024-01-25 DIAGNOSIS — J019 Acute sinusitis, unspecified: Secondary | ICD-10-CM

## 2024-01-25 MED ORDER — AZITHROMYCIN 250 MG PO TABS: ORAL_TABLET | ORAL | 0 refills | Status: AC

## 2024-01-25 NOTE — Progress Notes (Signed)
" ° °  Subjective:    Patient ID: [name] Bowman, female    DOB: 10-27-1975, 49 y.o.   MRN: [id number]  HPI Here for 2 weeks of sinus pressure, right ear pain, PND, and dry cough. No fever. Using Nyquil.    Review of Systems  Constitutional: Negative.   HENT:  Positive for congestion, ear pain, postnasal drip and sinus pressure. Negative for sore throat.   Eyes: Negative.   Respiratory:  Positive for cough. Negative for shortness of breath and wheezing.        Objective:   Physical Exam Constitutional:      Appearance: Normal appearance.  HENT:     Right Ear: Tympanic membrane, ear canal and external ear normal.     Left Ear: Tympanic membrane, ear canal and external ear normal.     Nose: Nose normal.     Mouth/Throat:     Pharynx: Oropharynx is clear.  Eyes:     Conjunctiva/sclera: Conjunctivae normal.  Pulmonary:     Effort: Pulmonary effort is normal.     Breath sounds: Normal breath sounds.  Lymphadenopathy:     Cervical: No cervical adenopathy.  Neurological:     Mental Status: She is alert.           Assessment & Plan:  Sinusitis, treat with a Zpack.  Garnette Olmsted, MD   "

## 2024-02-16 ENCOUNTER — Encounter: Payer: Self-pay | Admitting: Family Medicine

## 2024-02-16 DIAGNOSIS — I890 Lymphedema, not elsewhere classified: Secondary | ICD-10-CM

## 2024-02-17 DIAGNOSIS — I890 Lymphedema, not elsewhere classified: Secondary | ICD-10-CM | POA: Insufficient documentation

## 2024-02-17 NOTE — Telephone Encounter (Signed)
 Yes I will use lymphedema as the DX
